# Patient Record
Sex: Female | Born: 1955 | Hispanic: Yes | Marital: Married | Smoking: Never smoker
Health system: Southern US, Community
[De-identification: ages and names within clinical notes are randomized; demographics above are authoritative.]

## PROBLEM LIST (undated history)

## (undated) DIAGNOSIS — F419 Anxiety disorder, unspecified: Secondary | ICD-10-CM

## (undated) DIAGNOSIS — E785 Hyperlipidemia, unspecified: Secondary | ICD-10-CM

## (undated) DIAGNOSIS — G459 Transient cerebral ischemic attack, unspecified: Secondary | ICD-10-CM

## (undated) HISTORY — PX: NEPHRECTOMY TRANSPLANTED ORGAN: SUR880

---

## 2020-08-01 DIAGNOSIS — F418 Other specified anxiety disorders: Secondary | ICD-10-CM | POA: Insufficient documentation

## 2020-08-01 DIAGNOSIS — K589 Irritable bowel syndrome without diarrhea: Secondary | ICD-10-CM | POA: Insufficient documentation

## 2020-08-01 DIAGNOSIS — M170 Bilateral primary osteoarthritis of knee: Secondary | ICD-10-CM | POA: Insufficient documentation

## 2020-08-01 DIAGNOSIS — M47816 Spondylosis without myelopathy or radiculopathy, lumbar region: Secondary | ICD-10-CM | POA: Insufficient documentation

## 2020-08-01 DIAGNOSIS — E785 Hyperlipidemia, unspecified: Secondary | ICD-10-CM | POA: Insufficient documentation

## 2020-08-05 DIAGNOSIS — I10 Essential (primary) hypertension: Secondary | ICD-10-CM | POA: Insufficient documentation

## 2020-08-05 DIAGNOSIS — R072 Precordial pain: Secondary | ICD-10-CM | POA: Insufficient documentation

## 2020-12-15 ENCOUNTER — Ambulatory Visit
Admission: EM | Admit: 2020-12-15 | Discharge: 2020-12-15 | Disposition: A | Discharge status: Home / Self Care | Payer: BC Managed Care – PPO | Attending: Emergency Medicine | Admitting: Emergency Medicine

## 2020-12-15 ENCOUNTER — Encounter: Payer: Self-pay | Admitting: Emergency Medicine

## 2020-12-15 ENCOUNTER — Other Ambulatory Visit: Payer: Self-pay

## 2020-12-15 DIAGNOSIS — R8290 Unspecified abnormal findings in urine: Secondary | ICD-10-CM | POA: Insufficient documentation

## 2020-12-15 DIAGNOSIS — R1084 Generalized abdominal pain: Secondary | ICD-10-CM | POA: Insufficient documentation

## 2020-12-15 DIAGNOSIS — R110 Nausea: Secondary | ICD-10-CM | POA: Insufficient documentation

## 2020-12-15 HISTORY — DX: Hyperlipidemia, unspecified: E78.5

## 2020-12-15 HISTORY — DX: Transient cerebral ischemic attack, unspecified: G45.9

## 2020-12-15 HISTORY — DX: Anxiety disorder, unspecified: F41.9

## 2020-12-15 LAB — POCT URINALYSIS DIP (MANUAL ENTRY)
Bilirubin, UA: NEGATIVE
Glucose, UA: NEGATIVE mg/dL
Ketones, POC UA: NEGATIVE mg/dL
Nitrite, UA: NEGATIVE
Spec Grav, UA: 1.015 (ref 1.010–1.025)
Urobilinogen, UA: 0.2 E.U./dL
pH, UA: 8 (ref 5.0–8.0)

## 2020-12-15 MED ORDER — ONDANSETRON 4 MG PO TBDP: 4.0000 mg | ORAL_TABLET | Freq: Three times a day (TID) | ORAL | 0 refills | Status: DC | PRN

## 2020-12-15 NOTE — ED Provider Notes (Signed)
Renee Patrick    CSN: [id number] Arrival date & time: 12/15/20  1518      History   Chief Complaint Chief Complaint  Patient presents with   Abdominal Pain    HPI Renee Patrick is a 65 y.o. female.  Patient presents with 1 week history of nausea and abdominal pain.  She also reports malodorous urine.  She has been taking Pepto-Bismol which has caused her stools to be dark.  Patient gave herself an enema yesterday and experienced some relief of her symptoms.  She denies fever, chills, vomiting, diarrhea, dysuria, hematuria, blood in stool, or other symptoms.  Her medical history includes TIA, hyperlipidemia, anxiety,  The history is provided by the patient and medical records.   Past Medical History:  Diagnosis Date   Anxiety    Hyperlipidemia    TIA (transient ischemic attack)     There are no problems to display for this patient.   Past Surgical History:  Procedure Laterality Date   CESAREAN SECTION  1987   CESAREAN SECTION  1990   NEPHRECTOMY TRANSPLANTED ORGAN      OB History   No obstetric history on file.      Home Medications    Prior to Admission medications   Medication Sig Start Date End Date Taking? Authorizing Provider  linaCLOtide (LINZESS PO) Take by mouth.   Yes [provider]  ondansetron (ZOFRAN ODT) 4 MG disintegrating tablet Take 1 tablet (4 mg total) by mouth every 8 (eight) hours as needed for nausea or vomiting. 12/15/20  Yes Mickie Bail, NP  sertraline (ZOLOFT) 100 MG tablet Take 100 mg by mouth daily. 05/28/20  Yes [provider]    Family History No family history on file.  Social History Social History   Tobacco Use   Smoking status: Never   Smokeless tobacco: Never  Vaping Use   Vaping Use: Never used  Substance Use Topics   Alcohol use: Not Currently   Drug use: Never     Allergies   Patient has no known allergies.   Review of Systems Review of Systems  Constitutional:  Negative for chills  and fever.  Respiratory:  Negative for cough and shortness of breath.   Cardiovascular:  Negative for chest pain and palpitations.  Gastrointestinal:  Positive for abdominal pain and nausea. Negative for blood in stool, diarrhea and vomiting.  Genitourinary:  Negative for dysuria and hematuria.       Malodorous urine.  Skin:  Negative for color change and rash.  All other systems reviewed and are negative.   Physical Exam Triage Vital Signs ED Triage Vitals  Enc Vitals Group     BP      Pulse      Resp      Temp      Temp src      SpO2      Weight      Height      Head Circumference      Peak Flow      Pain Score      Pain Loc      Pain Edu?      Excl. in GC?    No data found.  Updated Vital Signs BP (!) 145/88 (BP Location: Right Arm)   Pulse (!) 55   Temp 97.9 F (36.6 C) (Oral)   Resp 15   Ht 5\' 1"  (1.549 m)   Wt 135 lb (61.2 kg)   LMP  (LMP  Unknown)   SpO2 97%   BMI 25.51 kg/m   Visual Acuity Right Eye Distance:   Left Eye Distance:   Bilateral Distance:    Right Eye Near:   Left Eye Near:    Bilateral Near:     Physical Exam Vitals and nursing note reviewed.  Constitutional:      General: She is not in acute distress.    Appearance: She is well-developed. She is not ill-appearing.  HENT:     Head: Normocephalic and atraumatic.     Mouth/Throat:     Mouth: Mucous membranes are moist.  Eyes:     Conjunctiva/sclera: Conjunctivae normal.  Cardiovascular:     Rate and Rhythm: Normal rate and regular rhythm.     Heart sounds: Normal heart sounds.  Pulmonary:     Effort: Pulmonary effort is normal. No respiratory distress.     Breath sounds: Normal breath sounds.  Abdominal:     General: Bowel sounds are normal. There is no distension.     Palpations: Abdomen is soft.     Tenderness: There is no abdominal tenderness. There is no right CVA tenderness, left CVA tenderness, guarding or rebound.  Musculoskeletal:     Cervical back: Neck supple.   Skin:    General: Skin is warm and dry.  Neurological:     General: No focal deficit present.     Mental Status: She is alert and oriented to person, place, and time.     Gait: Gait normal.  Psychiatric:        Mood and Affect: Mood normal.        Behavior: Behavior normal.     UC Treatments / Results  Labs (all labs ordered are listed, but only abnormal results are displayed) Labs Reviewed  POCT URINALYSIS DIP (MANUAL ENTRY) - Abnormal; Notable for the following components:      Result Value   Clarity, UA cloudy (*)    Blood, UA trace-intact (*)    Protein Ur, POC trace (*)    Leukocytes, UA Moderate (2+) (*)    All other components within normal limits  URINE CULTURE    EKG   Radiology No results found.  Procedures Procedures (including critical care time)  Medications Ordered in UC Medications - No data to display  Initial Impression / Assessment and Plan / UC Course  I have reviewed the triage vital signs and the nursing notes.  Pertinent labs & imaging results that were available during my care of the patient were reviewed by me and considered in my medical decision making (see chart for details).  Malodorous urine, nausea without vomiting, generalized abdominal pain.  Patient is well-appearing and her exam is reassuring.  Abdomen is soft and nontender.  She is afebrile.  Instructed patient to go to the emergency department if she has acute abdominal pain or other concerning symptoms.  Urine culture pending; discussed with patient that we will call her if it shows the need for treatment.  Treating nausea with Zofran.  Instructed patient to keep her self hydrated with clear liquids.  Instructed patient to follow-up with her PCP if her symptoms are not improving.  She agrees to plan of care.   Final Clinical Impressions(s) / UC Diagnoses   Final diagnoses:  Malodorous urine  Nausea without vomiting  Generalized abdominal pain     Discharge Instructions       Go to the emergency department if you have acute abdominal pain or other concerning symptoms.  Take the antinausea medication as directed.    Keep yourself hydrated with clear liquids, such as water, Gatorade, Pedialyte, Sprite, or ginger ale.    Follow up with your primary care provider if your symptoms are not improving.          ED Prescriptions     Medication Sig Dispense Auth. Provider   ondansetron (ZOFRAN ODT) 4 MG disintegrating tablet Take 1 tablet (4 mg total) by mouth every 8 (eight) hours as needed for nausea or vomiting. 20 tablet Mickie Bail, NP      PDMP not reviewed this encounter.   Mickie Bail, NP 12/15/20 [phone]

## 2020-12-15 NOTE — ED Triage Notes (Signed)
Patient c/o  ABD pain x 1 week.   Patient denies fever, diarrhea, emesis, and blood in stool.   Patient denies dysuria or change in urine characteristics.   Patient endorses nausea.   Patient endorses dark stools at times. Patient endorses pain worsens with eating.   Patient has taken Pepto Bismol and Linzess w/ no relief of symptoms.   Patient has used enema w/ some relief of symptoms.

## 2020-12-15 NOTE — Discharge Instructions (Addendum)
Go to the emergency department if you have acute abdominal pain or other concerning symptoms.    Take the antinausea medication as directed.    Keep yourself hydrated with clear liquids, such as water, Gatorade, Pedialyte, Sprite, or ginger ale.    Follow up with your primary care provider if your symptoms are not improving.

## 2020-12-18 ENCOUNTER — Telehealth: Payer: Self-pay | Admitting: Emergency Medicine

## 2020-12-18 LAB — URINE CULTURE: Culture: 80000 — AB

## 2020-12-18 MED ORDER — NITROFURANTOIN MONOHYD MACRO 100 MG PO CAPS: 100.0000 mg | ORAL_CAPSULE | Freq: Two times a day (BID) | ORAL | 0 refills | Status: DC

## 2020-12-19 ENCOUNTER — Telehealth: Payer: Self-pay

## 2020-12-19 NOTE — Telephone Encounter (Signed)
Left vm to confirm 12/22/20 appointment-Toni 

## 2020-12-22 ENCOUNTER — Other Ambulatory Visit: Payer: Self-pay

## 2020-12-22 ENCOUNTER — Telehealth: Payer: Self-pay

## 2020-12-22 ENCOUNTER — Ambulatory Visit: Payer: BC Managed Care – PPO | Admitting: Physician Assistant

## 2020-12-22 ENCOUNTER — Encounter: Payer: Self-pay | Admitting: Physician Assistant

## 2020-12-22 DIAGNOSIS — K5900 Constipation, unspecified: Secondary | ICD-10-CM

## 2020-12-22 DIAGNOSIS — M25562 Pain in left knee: Secondary | ICD-10-CM

## 2020-12-22 DIAGNOSIS — E559 Vitamin D deficiency, unspecified: Secondary | ICD-10-CM

## 2020-12-22 DIAGNOSIS — F411 Generalized anxiety disorder: Secondary | ICD-10-CM | POA: Diagnosis not present

## 2020-12-22 DIAGNOSIS — M545 Low back pain, unspecified: Secondary | ICD-10-CM | POA: Diagnosis not present

## 2020-12-22 DIAGNOSIS — G8929 Other chronic pain: Secondary | ICD-10-CM

## 2020-12-22 DIAGNOSIS — M25561 Pain in right knee: Secondary | ICD-10-CM | POA: Diagnosis not present

## 2020-12-22 DIAGNOSIS — Z7689 Persons encountering health services in other specified circumstances: Secondary | ICD-10-CM

## 2020-12-22 DIAGNOSIS — E782 Mixed hyperlipidemia: Secondary | ICD-10-CM

## 2020-12-22 DIAGNOSIS — R5383 Other fatigue: Secondary | ICD-10-CM

## 2020-12-22 DIAGNOSIS — E538 Deficiency of other specified B group vitamins: Secondary | ICD-10-CM

## 2020-12-22 NOTE — Telephone Encounter (Signed)
Orthopedic referral sent via [facility] to [facility]-Toni 

## 2020-12-22 NOTE — Progress Notes (Signed)
[facility] [address] [place], [place] [address]  Internal MEDICINE  Office Visit Note  Patient Name: Renee Patrick  [id number]  [id number]  Date of Service: 12/28/2020   Complaints/HPI Pt is here for establishment of PCP. Chief Complaint  Patient presents with   New Patient (Initial Visit)    Need awv   Constipation   Anxiety   Knee Pain    REFERRAL FOR ORTHOPEDIC    HPI Pt is here to establish care -Pt was just married in Jan and moved from [place], and husband's job was in [place] but then moved to [place] -Was taking Linzess for constipation, takes laxative-Afleet, was thinking she was having bad reaction to linzess with bloating and would like to establish with GI -Colonoscopy 2 months ago-small polyps, had one removed -Was told she needed cholesterol med but had side effects and wants to work on diet. Mini stoke 4 yrs ago -Daughter's have concerns for her memory and want to see neurologist. 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 Around grandkids frequently-has 14 all together.  -Loves sports, but knee pain limits this, goes to concerts and sporting events. Looking for a job to keep her busy and know the area better -Is bone on bone in both knees, has been getting injections with ortho. Putting off replacements. Also has low back pain.  -never been a smoker, only drinks alcohol on occasion, denies any other substance use -Sleep is ok and eating well -Covid 1 month ago and starting to get energy back -Does take zoloft for anxiety and is well controlled  Current Medication: Outpatient Encounter Medications as of 12/22/2020  Medication Sig   ondansetron (ZOFRAN ODT) 4 MG disintegrating tablet Take 1 tablet (4 mg total) by mouth every 8 (eight) hours as needed for nausea or vomiting.   sertraline (ZOLOFT) 100 MG tablet Take 100 mg by mouth daily.   linaCLOtide (LINZESS PO) Take by mouth. (Patient not taking: Reported on  12/22/2020)   [DISCONTINUED] nitrofurantoin, macrocrystal-monohydrate, (MACROBID) 100 MG capsule Take 1 capsule (100 mg total) by mouth 2 (two) times daily.   No facility-administered encounter medications on file as of 12/22/2020.    Surgical History: Past Surgical History:  Procedure Laterality Date   CESAREAN SECTION  1987   CESAREAN SECTION  1990    Medical History: Past Medical History:  Diagnosis Date   Anxiety    Hyperlipidemia    TIA (transient ischemic attack)     Family History: Family History  Problem Relation Age of Onset   Ovarian cancer Mother    Lung cancer Father    Kidney disease Maternal Uncle    Hypertension Maternal Uncle     Social History   Socioeconomic History   Marital status: Married    Spouse name: Not on file   Number of children: Not on file   Years of education: Not on file   Highest education level: Not on file  Occupational History   Not on file  Tobacco Use   Smoking status: Never   Smokeless tobacco: Never  Vaping Use   Vaping Use: Never used  Substance and Sexual Activity   Alcohol use: Not Currently   Drug use: Never   Sexual activity: Not on file  Other Topics Concern   Not on file  Social History Narrative   Not on file   Social Determinants of Health   Financial Resource Strain: Not on file  Food Insecurity: Not on  file  Transportation Needs: Not on file  Physical Activity: Not on file  Stress: Not on file  Social Connections: Not on file  Intimate Partner Violence: Not on file     Review of Systems  Constitutional:  Negative for chills, fatigue and unexpected weight change.  HENT:  Negative for congestion, postnasal drip, rhinorrhea, sneezing and sore throat.   Eyes:  Negative for redness.  Respiratory:  Negative for cough, chest tightness and shortness of breath.   Cardiovascular:  Negative for chest pain and palpitations.  Gastrointestinal:  Negative for abdominal pain, constipation, diarrhea, nausea and  vomiting.  Genitourinary:  Negative for dysuria and frequency.  Musculoskeletal:  Positive for arthralgias and back pain. Negative for joint swelling and neck pain.  Skin:  Negative for rash.  Neurological: Negative.  Negative for tremors and numbness.  Hematological:  Negative for adenopathy. Does not bruise/bleed easily.  Psychiatric/Behavioral:  Negative for behavioral problems (Depression), sleep disturbance and suicidal ideas. The patient is not nervous/anxious.    Vital Signs: BP 132/80   Pulse 64   Temp 97.8 F (36.6 C)   Resp 16   Ht 5' 1.25" (1.556 m)   Wt 135 lb (61.2 kg)   LMP  (LMP Unknown)   SpO2 98%   BMI 25.30 kg/m    Physical Exam Vitals and nursing note reviewed.  Constitutional:      General: She is not in acute distress.    Appearance: She is well-developed and normal weight. She is not diaphoretic.  HENT:     Head: Normocephalic and atraumatic.     Mouth/Throat:     Pharynx: No oropharyngeal exudate.  Eyes:     Pupils: Pupils are equal, round, and reactive to light.  Neck:     Thyroid: No thyromegaly.     Vascular: No JVD.     Trachea: No tracheal deviation.  Cardiovascular:     Rate and Rhythm: Normal rate and regular rhythm.     Heart sounds: Normal heart sounds. No murmur heard.   No friction rub. No gallop.  Pulmonary:     Effort: Pulmonary effort is normal. No respiratory distress.     Breath sounds: No wheezing or rales.  Chest:     Chest wall: No tenderness.  Abdominal:     General: Bowel sounds are normal.     Palpations: Abdomen is soft.  Musculoskeletal:        General: Normal range of motion.     Cervical back: Normal range of motion and neck supple.  Lymphadenopathy:     Cervical: No cervical adenopathy.  Skin:    General: Skin is warm and dry.  Neurological:     Mental Status: She is alert and oriented to person, place, and time.     Cranial Nerves: No cranial nerve deficit.  Psychiatric:        Behavior: Behavior normal.         Thought Content: Thought content normal.        Judgment: Judgment normal.      Assessment/Plan: 1. GAD (generalized anxiety disorder) Well-controlled continue Zoloft  2. Constipation, unspecified constipation type Due to poor reaction to Linzess and over-the-counter is not working well patient will be referred to GI - Ambulatory referral to Gastroenterology  3. Chronic pain of both knees Patient looking to establish with orthopedics locally as she was previously receiving injections and will be referred to Ortho - AMB referral to orthopedics  4. Chronic bilateral low back pain  without sciatica Will refer to Ortho - AMB referral to orthopedics  5. Encounter to establish care with new doctor Patient here to establish care and baseline labs will be ordered  6. Mixed hyperlipidemia - Lipid Panel With LDL/HDL Ratio  7. B12 deficiency - B12 and Folate Panel  8. Vitamin D deficiency - VITAMIN D 25 Hydroxy (Vit-D Deficiency, Fractures)  9. Other fatigue - CBC w/Diff/Platelet - Comprehensive metabolic panel - TSH + free T4   General Counseling: Natalija verbalizes understanding of the findings of todays visit and agrees with plan of treatment. I have discussed any further diagnostic evaluation that may be needed or ordered today. We also reviewed her medications today. she has been encouraged to call the office with any questions or concerns that should arise related to todays visit.    Counseling:    Orders Placed This Encounter  Procedures   CBC w/Diff/Platelet   Comprehensive metabolic panel   Lipid Panel With LDL/HDL Ratio   TSH + free T4   B12 and Folate Panel   VITAMIN D 25 Hydroxy (Vit-D Deficiency, Fractures)   AMB referral to orthopedics   Ambulatory referral to Gastroenterology    No orders of the defined types were placed in this encounter.    This patient was seen by Lynn Ito, PA-C in collaboration with Dr. Beverely Risen as a part of  collaborative care agreement.   Time spent:35 Minutes

## 2021-01-06 ENCOUNTER — Encounter: Payer: Self-pay | Admitting: Gastroenterology

## 2021-01-06 ENCOUNTER — Other Ambulatory Visit: Payer: Self-pay

## 2021-01-06 ENCOUNTER — Ambulatory Visit: Payer: BC Managed Care – PPO | Admitting: Gastroenterology

## 2021-01-06 VITALS — BP 133/76 | HR 66 | Temp 98.1°F | Ht 61.25 in | Wt 137.0 lb

## 2021-01-06 DIAGNOSIS — K5909 Other constipation: Secondary | ICD-10-CM | POA: Diagnosis not present

## 2021-01-06 MED ORDER — LINACLOTIDE 290 MCG PO CAPS: 290.0000 ug | ORAL_CAPSULE | Freq: Every day | ORAL | 3 refills | Status: AC

## 2021-01-06 MED ORDER — LINACLOTIDE 290 MCG PO CAPS: 290.0000 ug | ORAL_CAPSULE | Freq: Every day | ORAL | 2 refills | Status: DC

## 2021-01-06 NOTE — Progress Notes (Signed)
Wyline Mood MD, MRCP(U.K) [address]  [address]  [place], [place] [address]  Main: [phone]  Fax: [phone]   Gastroenterology Consultation  Referring Provider:     Carlean Jews, PA* Primary Care Physician:  Carlean Jews, PA-C Primary Gastroenterologist:  Dr. Wyline Mood  Reason for Consultation:     Constipation        HPI:   Renee Patrick is a 65 y.o. y/o female referred for consultation & management  by  Carlean Jews, PA-C.    She states that she is here to see me for constipation ongoing for many years.  Does not have a bowel movement for 3 to 4 days at a time.  When she does it very hard in nature like rocks.  If she does not have a good bowel movement she has a lot of abdominal distention bloating and discomfort.  She was recently started on Linzess 290 mcg a day and has very good bowel movements with it.  She wanted to know if there are any long-term side effects with it.  Diet is probably not rich in fiber.  She had colonoscopy about 3 months back in [place] and was told it was normal.  She just moved back to the area recently.  Denies any rectal bleeding or any other symptoms.    Past Medical History:  Diagnosis Date   Anxiety    Hyperlipidemia    TIA (transient ischemic attack)     Past Surgical History:  Procedure Laterality Date   CESAREAN SECTION  1987   CESAREAN SECTION  1990    Prior to Admission medications   Medication Sig Start Date End Date Taking? Authorizing Provider  linaCLOtide (LINZESS PO) Take by mouth. Patient not taking: Reported on 12/22/2020    [provider]  ondansetron (ZOFRAN ODT) 4 MG disintegrating tablet Take 1 tablet (4 mg total) by mouth every 8 (eight) hours as needed for nausea or vomiting. 12/15/20   Mickie Bail, NP  rosuvastatin (CRESTOR) 40 MG tablet Take 40 mg by mouth daily. 12/11/20   [provider]  sertraline (ZOLOFT) 100 MG tablet Take 100 mg by mouth daily. 05/28/20    [provider]    Family History  Problem Relation Age of Onset   Ovarian cancer Mother    Lung cancer Father    Kidney disease Maternal Uncle    Hypertension Maternal Uncle      Social History   Tobacco Use   Smoking status: Never   Smokeless tobacco: Never  Vaping Use   Vaping Use: Never used  Substance Use Topics   Alcohol use: Not Currently   Drug use: Never    Allergies as of 01/06/2021   (No Known Allergies)    Review of Systems:    All systems reviewed and negative except where noted in HPI.   Physical Exam:  BP 133/76   Pulse 66   Temp 98.1 F (36.7 C) (Oral)   Ht 5' 1.25" (1.556 m)   Wt 137 lb (62.1 kg)   LMP  (LMP Unknown)   BMI 25.68 kg/m  No LMP recorded (lmp unknown). Patient is postmenopausal. Psych:  Alert and cooperative. Normal mood and affect. General:   Alert,  Well-developed, well-nourished, pleasant and cooperative in NAD Head:  Normocephalic and atraumatic. Eyes:  Sclera clear, no icterus.   Conjunctiva pink. Abdomen:  Normal bowel sounds.  No bruits.  Soft, non-tender and non-distended without masses, hepatosplenomegaly or  hernias noted.  No guarding or rebound tenderness.    Neurologic:  Alert and oriented x3;  grossly normal neurologically. Psych:  Alert and cooperative. Normal mood and affect.  Imaging Studies: No results found.  Assessment and Plan:   Renee Patrick is a 65 y.o. y/o female has been referred for chronic constipation.  No red flag signs.  Recent colonoscopy in [place] and was told it was normal.  Plan 1.  High-fiber diet counseled to obtain 25 g of fiber in her diet per day.  If not meeting goal to commence on Metamucil as a supplement. 2.  Linzess to 290 mcg, 90-day supply with 3 refills will be provided   Follow up in 3 to 4 months  Dr Wyline Mood MD,MRCP(U.K)

## 2021-01-13 ENCOUNTER — Telehealth: Payer: Self-pay

## 2021-01-13 NOTE — Telephone Encounter (Signed)
Patient scheduled w/ [facility] on 01/14/21 @ 1:30-Toni

## 2021-03-05 ENCOUNTER — Encounter: Payer: Self-pay | Admitting: Physician Assistant

## 2021-03-05 ENCOUNTER — Ambulatory Visit (INDEPENDENT_AMBULATORY_CARE_PROVIDER_SITE_OTHER): Payer: BC Managed Care – PPO | Admitting: Physician Assistant

## 2021-03-05 ENCOUNTER — Telehealth: Payer: Self-pay

## 2021-03-05 ENCOUNTER — Other Ambulatory Visit: Payer: Self-pay | Admitting: Family Medicine

## 2021-03-05 ENCOUNTER — Other Ambulatory Visit: Payer: Self-pay

## 2021-03-05 DIAGNOSIS — R300 Dysuria: Secondary | ICD-10-CM

## 2021-03-05 DIAGNOSIS — M25562 Pain in left knee: Secondary | ICD-10-CM

## 2021-03-05 DIAGNOSIS — M064 Inflammatory polyarthropathy: Secondary | ICD-10-CM

## 2021-03-05 DIAGNOSIS — R5383 Other fatigue: Secondary | ICD-10-CM

## 2021-03-05 DIAGNOSIS — G8929 Other chronic pain: Secondary | ICD-10-CM

## 2021-03-05 DIAGNOSIS — M545 Low back pain, unspecified: Secondary | ICD-10-CM | POA: Diagnosis not present

## 2021-03-05 DIAGNOSIS — Z124 Encounter for screening for malignant neoplasm of cervix: Secondary | ICD-10-CM

## 2021-03-05 DIAGNOSIS — E559 Vitamin D deficiency, unspecified: Secondary | ICD-10-CM

## 2021-03-05 DIAGNOSIS — M25561 Pain in right knee: Secondary | ICD-10-CM | POA: Diagnosis not present

## 2021-03-05 DIAGNOSIS — K5900 Constipation, unspecified: Secondary | ICD-10-CM

## 2021-03-05 DIAGNOSIS — E538 Deficiency of other specified B group vitamins: Secondary | ICD-10-CM

## 2021-03-05 DIAGNOSIS — Z0001 Encounter for general adult medical examination with abnormal findings: Secondary | ICD-10-CM | POA: Diagnosis not present

## 2021-03-05 DIAGNOSIS — F411 Generalized anxiety disorder: Secondary | ICD-10-CM | POA: Diagnosis not present

## 2021-03-05 DIAGNOSIS — Z01419 Encounter for gynecological examination (general) (routine) without abnormal findings: Secondary | ICD-10-CM

## 2021-03-05 DIAGNOSIS — E782 Mixed hyperlipidemia: Secondary | ICD-10-CM

## 2021-03-05 DIAGNOSIS — Z112 Encounter for screening for other bacterial diseases: Secondary | ICD-10-CM

## 2021-03-05 DIAGNOSIS — M5416 Radiculopathy, lumbar region: Secondary | ICD-10-CM

## 2021-03-05 NOTE — Progress Notes (Signed)
[facility] [place], [place] [address]  Internal MEDICINE  Office Visit Note  Patient Name: Renee Patrick  [id number]  [id number]  Date of Service: 03/10/2021  Chief Complaint  Patient presents with   Annual Exam   Anxiety   Hyperlipidemia     HPI Pt is here for routine health maintenance examination and has multiple medical concerns -Was in the hospital for bleeding ulcer and was given 2 units of blood. Also states a biopsy was done but has not heard results and has another procedure for endoscopy In Feb with office visit in Dec. -Ortho injected both knees yesterday, so they are currently painful -Did some PT for back but not full course, considering back injections. Going to been seen by physiatry -Arthritis in hand has been worse lately too -Mom passed with ovarian cancer -pt requests pap today, however states she had a partial hysterectomy at age 12...but still had been having pap done since then and is unsure about whether she has a cervix. -Unfortunately her old records are not available still and she was again told to obtain these so we can have up to date records for her medical history in order to best treat her -She did not have her labs done and will go now -UTD on colonoscopy and mammogram  Current Medication: Outpatient Encounter Medications as of 03/05/2021  Medication Sig   linaclotide (LINZESS) 290 MCG CAPS capsule Take 1 capsule (290 mcg total) by mouth daily.   ondansetron (ZOFRAN ODT) 4 MG disintegrating tablet Take 1 tablet (4 mg total) by mouth every 8 (eight) hours as needed for nausea or vomiting.   sertraline (ZOLOFT) 100 MG tablet Take 100 mg by mouth daily.   [DISCONTINUED] rosuvastatin (CRESTOR) 40 MG tablet Take 40 mg by mouth daily. (Patient not taking: Reported on 03/05/2021)   No facility-administered encounter medications on file as of 03/05/2021.    Surgical History: Past Surgical History:  Procedure Laterality  Date   [place]    Medical History: Past Medical History:  Diagnosis Date   Anxiety    Hyperlipidemia    TIA (transient ischemic attack)     Family History: Family History  Problem Relation Age of Onset   Ovarian cancer Mother    Lung cancer Father    Kidney disease Maternal Uncle    Hypertension Maternal Uncle       Review of Systems  Constitutional:  Negative for chills, fatigue and unexpected weight change.  HENT:  Negative for congestion, postnasal drip, rhinorrhea, sneezing and sore throat.   Eyes:  Negative for redness.  Respiratory:  Negative for cough, chest tightness and shortness of breath.   Cardiovascular:  Negative for chest pain and palpitations.  Gastrointestinal:  Negative for abdominal pain, constipation, diarrhea, nausea and vomiting.  Genitourinary:  Negative for dysuria and frequency.  Musculoskeletal:  Positive for arthralgias and back pain. Negative for joint swelling and neck pain.  Skin:  Negative for rash.  Neurological: Negative.  Negative for tremors and numbness.  Hematological:  Negative for adenopathy. Does not bruise/bleed easily.  Psychiatric/Behavioral:  Negative for behavioral problems (Depression), sleep disturbance and suicidal ideas. The patient is nervous/anxious.     Vital Signs: BP 119/76   Pulse 65   Temp 98 F (36.7 C)   Resp 16   Ht 5' 1.25" (1.556 m)   Wt 137 lb 9.6 oz (62.4 kg)   LMP  (LMP Unknown)  SpO2 99%   BMI 25.79 kg/m    Physical Exam Vitals and nursing note reviewed.  Constitutional:      General: She is not in acute distress.    Appearance: She is well-developed and normal weight. She is not diaphoretic.  HENT:     Head: Normocephalic and atraumatic.     Mouth/Throat:     Pharynx: No oropharyngeal exudate.  Eyes:     Pupils: Pupils are equal, round, and reactive to light.  Neck:     Thyroid: No thyromegaly.     Vascular: No JVD.     Trachea: No tracheal  deviation.  Cardiovascular:     Rate and Rhythm: Normal rate and regular rhythm.     Heart sounds: Normal heart sounds. No murmur heard.   No friction rub. No gallop.  Pulmonary:     Effort: Pulmonary effort is normal. No respiratory distress.     Breath sounds: No wheezing or rales.  Chest:     Chest wall: No tenderness.  Abdominal:     General: Bowel sounds are normal.     Palpations: Abdomen is soft.  Genitourinary:    Exam position: Lithotomy position.     Vagina: Vaginal discharge present.     Comments: Unable to visualize cervix, blind pap performed Musculoskeletal:        General: Normal range of motion.     Cervical back: Normal range of motion and neck supple.  Lymphadenopathy:     Cervical: No cervical adenopathy.  Skin:    General: Skin is warm and dry.  Neurological:     Mental Status: She is alert and oriented to person, place, and time.     Cranial Nerves: No cranial nerve deficit.  Psychiatric:        Behavior: Behavior normal.        Thought Content: Thought content normal.        Judgment: Judgment normal.     LABS: Recent Results (from the past 2160 hour(s))  POCT urinalysis dipstick     Status: Abnormal   Collection Time: 12/15/20  3:36 PM  Result Value Ref Range   Color, UA yellow yellow   Clarity, UA cloudy (A) clear   Glucose, UA negative negative mg/dL   Bilirubin, UA negative negative   Ketones, POC UA negative negative mg/dL   Spec Grav, UA 1.015 1.010 - 1.025   Blood, UA trace-intact (A) negative   pH, UA 8.0 5.0 - 8.0   Protein Ur, POC trace (A) negative mg/dL   Urobilinogen, UA 0.2 0.2 or 1.0 E.U./dL   Nitrite, UA Negative Negative   Leukocytes, UA Moderate (2+) (A) Negative  Urine Culture     Status: Abnormal   Collection Time: 12/15/20  3:55 PM   Specimen: Urine, Clean Catch  Result Value Ref Range   Specimen Description URINE, CLEAN CATCH    Special Requests      NONE Performed at [facility], [address]  [place], [place] [address]    Culture (A)     80,000 COLONIES/mL ESCHERICHIA COLI 10,000 COLONIES/mL ENTEROCOCCUS FAECALIS    Report Status 12/18/2020 FINAL    Organism ID, Bacteria ESCHERICHIA COLI (A)    Organism ID, Bacteria ENTEROCOCCUS FAECALIS (A)       Susceptibility   Escherichia coli - MIC*    AMPICILLIN >=32 RESISTANT Resistant     CEFAZOLIN <=4 SENSITIVE Sensitive     CEFEPIME <=0.12 SENSITIVE Sensitive     CEFTRIAXONE <=  0.25 SENSITIVE Sensitive     CIPROFLOXACIN <=0.25 SENSITIVE Sensitive     GENTAMICIN <=1 SENSITIVE Sensitive     IMIPENEM <=0.25 SENSITIVE Sensitive     NITROFURANTOIN <=16 SENSITIVE Sensitive     TRIMETH/SULFA <=20 SENSITIVE Sensitive     AMPICILLIN/SULBACTAM 16 INTERMEDIATE Intermediate     PIP/TAZO <=4 SENSITIVE Sensitive     * 80,000 COLONIES/mL ESCHERICHIA COLI   Enterococcus faecalis - MIC*    AMPICILLIN <=2 SENSITIVE Sensitive     NITROFURANTOIN <=16 SENSITIVE Sensitive     VANCOMYCIN 1 SENSITIVE Sensitive     * 10,000 COLONIES/mL ENTEROCOCCUS FAECALIS  CBC w/Diff/Platelet     Status: None   Collection Time: 03/05/21  1:20 PM  Result Value Ref Range   WBC 7.7 3.4 - 10.8 x10E3/uL   RBC 4.58 3.77 - 5.28 x10E6/uL   Hemoglobin 12.8 11.1 - 15.9 g/dL   Hematocrit 38.6 34.0 - 46.6 %   MCV 84 79 - 97 fL   MCH 27.9 26.6 - 33.0 pg   MCHC 33.2 31.5 - 35.7 g/dL   RDW 12.3 11.7 - 15.4 %   Platelets 257 150 - 450 x10E3/uL   Neutrophils 60 Not Estab. %   Lymphs 29 Not Estab. %   Monocytes 9 Not Estab. %   Eos 1 Not Estab. %   Basos 1 Not Estab. %   Neutrophils Absolute 4.6 1.4 - 7.0 x10E3/uL   Lymphocytes Absolute 2.2 0.7 - 3.1 x10E3/uL   Monocytes Absolute 0.7 0.1 - 0.9 x10E3/uL   EOS (ABSOLUTE) 0.1 0.0 - 0.4 x10E3/uL   Basophils Absolute 0.1 0.0 - 0.2 x10E3/uL   Immature Granulocytes 0 Not Estab. %   Immature Grans (Abs) 0.0 0.0 - 0.1 x10E3/uL  Comprehensive metabolic panel     Status: Abnormal   Collection Time: 03/05/21  1:20 PM  Result Value  Ref Range   Glucose 94 70 - 99 mg/dL   BUN 11 8 - 27 mg/dL   Creatinine, Ser 0.88 0.57 - 1.00 mg/dL   eGFR 73 >59 mL/min/1.73   BUN/Creatinine Ratio 13 12 - 28   Sodium 145 (H) 134 - 144 mmol/L   Potassium 4.2 3.5 - 5.2 mmol/L   Chloride 104 96 - 106 mmol/L   CO2 25 20 - 29 mmol/L   Calcium 9.7 8.7 - 10.3 mg/dL   Total Protein 7.0 6.0 - 8.5 g/dL   Albumin 4.7 3.8 - 4.8 g/dL   Globulin, Total 2.3 1.5 - 4.5 g/dL   Albumin/Globulin Ratio 2.0 1.2 - 2.2   Bilirubin Total 0.5 0.0 - 1.2 mg/dL   Alkaline Phosphatase 78 44 - 121 IU/L   AST 24 0 - 40 IU/L   ALT 11 0 - 32 IU/L  Lipid Panel With LDL/HDL Ratio     Status: Abnormal   Collection Time: 03/05/21  1:20 PM  Result Value Ref Range   Cholesterol, Total 267 (H) 100 - 199 mg/dL   Triglycerides 191 (H) 0 - 149 mg/dL   HDL 47 >39 mg/dL   VLDL Cholesterol Cal 36 5 - 40 mg/dL   LDL Chol Calc (NIH) 184 (H) 0 - 99 mg/dL   LDL/HDL Ratio 3.9 (H) 0.0 - 3.2 ratio    Comment:                                     LDL/HDL Ratio  Men  Women                               1/2 Avg.Risk  1.0    1.5                                   Avg.Risk  3.6    3.2                                2X Avg.Risk  6.2    5.0                                3X Avg.Risk  8.0    6.1   TSH + free T4     Status: None   Collection Time: 03/05/21  1:20 PM  Result Value Ref Range   TSH 0.763 0.450 - 4.500 uIU/mL   Free T4 1.22 0.82 - 1.77 ng/dL  B12 and Folate Panel     Status: Abnormal   Collection Time: 03/05/21  1:20 PM  Result Value Ref Range   Vitamin B-12 1,676 (H) 232 - 1,245 pg/mL   Folate >20.0 >3.0 ng/mL    Comment: A serum folate concentration of less than 3.1 ng/mL is considered to represent clinical deficiency.   VITAMIN D 25 Hydroxy (Vit-D Deficiency, Fractures)     Status: Abnormal   Collection Time: 03/05/21  1:20 PM  Result Value Ref Range   Vit D, 25-Hydroxy 27.5 (L) 30.0 - 100.0 ng/mL    Comment: Vitamin D  deficiency has been defined by the [place] practice guideline as a level of serum 25-OH vitamin D less than 20 ng/mL (1,2). The Endocrine Society went on to further define vitamin D insufficiency as a level between 21 and 29 ng/mL (2). 1. IOM (Institute of Medicine). 2010. Dietary reference    intakes for calcium and D. [employer]: The    [employer]. 2. Holick MF, Binkley Benld, Bischoff-Ferrari HA, et al.    Evaluation, treatment, and prevention of vitamin D    deficiency: an Endocrine Society clinical practice    guideline. JCEM. 2011 Jul; 96(7):1911-30.   Rheumatoid Factor     Status: None   Collection Time: 03/05/21  1:20 PM  Result Value Ref Range   Rhuematoid fact SerPl-aCnc <10.0 <14.0 IU/mL  ANA w/Reflex if Positive     Status: Abnormal   Collection Time: 03/05/21  1:20 PM  Result Value Ref Range   Anti Nuclear Antibody (ANA) Positive (A) Negative   dsDNA Ab 1 0 - 9 IU/mL    Comment:                                    Negative      <5                                    Equivocal  5 - 9  Positive      >9    ENA RNP Ab 0.2 0.0 - 0.9 AI   ENA SM Ab Ser-aCnc <0.2 0.0 - 0.9 AI   Scleroderma (Scl-70) (ENA) Antibody, IgG <0.2 0.0 - 0.9 AI   ENA SSA (RO) Ab 1.1 (H) 0.0 - 0.9 AI   ENA SSB (LA) Ab <0.2 0.0 - 0.9 AI   Chromatin Ab SerPl-aCnc <0.2 0.0 - 0.9 AI   Anti JO-1 <0.2 0.0 - 0.9 AI   Centromere Ab Screen <0.2 0.0 - 0.9 AI   See below: Comment     Comment: Autoantibody                       Disease Association ------------------------------------------------------------                         Condition                  Frequency ---------------------   ------------------------   --------- Antinuclear Antibody,    SLE, mixed connective Direct (ANA-D)           tissue diseases ---------------------   ------------------------   --------- dsDNA                    SLE                         40 - 60% ---------------------   ------------------------   --------- Chromatin                Drug induced SLE                90%                          SLE                        48 - 97% ---------------------   ------------------------   --------- SSA (Ro)                 SLE                        25 - 35%                          Sjogren's Syndrome         40 - 70%                          Neonatal Lupus                 100% ---------------------   ------------------------   --------- SSB (La)                 SLE                              10%                          Sjogren's Syndrome              30% ---------------------   -----------------------    --------- Sm (anti-Smith)  SLE                        15 - 30% ---------------------   -----------------------    --------- RNP                      Mixed Connective Tissue                          Disease                         95% (U1 nRNP,                SLE                        30 - 50% anti-ribonucleoprotein)  Polymyositis and/or                          Dermatomyositis                 20% ---------------------   ------------------------   --------- Scl-70 (antiDNA          Scleroderma (diffuse)      20 - 35% topoisomerase)           Crest                           13% ---------------------   ------------------------   --------- Jo-1                     Polymyositis and/or                          Dermatomyositis            20 - 40% ---------------------   ------------------------   --------- Centromere B             Scleroderma -  Crest                          variant                         80%   IGP, Aptima HPV     Status: None   Collection Time: 03/05/21  2:39 PM  Result Value Ref Range   Interpretation NILM     Comment: NEGATIVE FOR INTRAEPITHELIAL LESION OR MALIGNANCY.   Category NIL     Comment: Negative for Intraepithelial Lesion   Adequacy SECNI     Comment: Satisfactory for evaluation. No  endocervical component is identified.   Clinician Provided ICD10 Comment     Comment: Z12.4   Performed by: Comment     Comment: Krystal Eaton, Cytotechnologist (ASCP)   Note: Comment     Comment: The Pap smear is a screening test designed to aid in the detection of premalignant and malignant conditions of the uterine cervix.  It is not a diagnostic procedure and should not be used as the sole means of detecting cervical cancer.  Both false-positive and false-negative reports do occur.    Test Methodology Comment     Comment: This liquid based ThinPrep(R) pap test was screened with the use of an image guided system.  HPV Aptima Negative Negative    Comment: This nucleic acid amplification test detects fourteen high-risk HPV types (16,18,31,33,35,39,45,51,52,56,58,59,66,68) without differentiation.   UA/M w/rflx Culture, Routine     Status: Abnormal (Preliminary result)   Collection Time: 03/05/21  2:39 PM   Specimen: Urine   Urine  Result Value Ref Range   Specific Gravity, UA 1.014 1.005 - 1.030   pH, UA 6.0 5.0 - 7.5   Color, UA Yellow Yellow   Appearance Ur Cloudy (A) Clear   Leukocytes,UA Negative Negative   Protein,UA Negative Negative/Trace   Glucose, UA Negative Negative   Ketones, UA Negative Negative   RBC, UA Negative Negative   Bilirubin, UA Negative Negative   Urobilinogen, Ur 0.2 0.2 - 1.0 mg/dL   Nitrite, UA Negative Negative   Microscopic Examination Comment     Comment: Microscopic follows if indicated.   Microscopic Examination See below:     Comment: Microscopic was indicated and was performed.   Urinalysis Reflex Comment     Comment: This specimen has reflexed to a Urine Culture.  NuSwab Vaginitis Plus (VG+)     Status: Abnormal   Collection Time: 03/05/21  2:39 PM  Result Value Ref Range   Atopobium vaginae Low - 0 Score   BVAB 2 Low - 0 Score   Megasphaera 1 Low - 0 Score    Comment: Calculate total score by adding the 3 individual  bacterial vaginosis (BV) marker scores together.  Total score is interpreted as follows: Total score 0-1: Indicates the absence of BV. Total score   2: Indeterminate for BV. Additional clinical                  data should be evaluated to establish a                  diagnosis. Total score 3-6: Indicates the presence of BV. This test was developed and its performance characteristics determined by [facility].  It has not been cleared or approved by the Food and Drug Administration.    Candida albicans, NAA Positive (A) Negative   Candida glabrata, NAA Negative Negative   Trich vag by NAA Negative Negative   Chlamydia trachomatis, NAA Negative Negative   Neisseria gonorrhoeae, NAA Negative Negative  Microscopic Examination     Status: Abnormal   Collection Time: 03/05/21  2:39 PM   Urine  Result Value Ref Range   WBC, UA 6-10 (A) 0 - 5 /hpf   RBC None seen 0 - 2 /hpf   Epithelial Cells (non renal) None seen 0 - 10 /hpf   Casts None seen None seen /lpf   Bacteria, UA Many (A) None seen/Few  Urine Culture, Reflex     Status: None (Preliminary result)   Collection Time: 03/05/21  2:39 PM   Urine  Result Value Ref Range   Urine Culture, Routine WILL FOLLOW         Assessment/Plan: 1. Encounter for general adult medical examination with abnormal findings CPE performed, routine labs ordered, UTD on mammogram and colonoscopy. Pt will work on obtaining medical records  2. GAD (generalized anxiety disorder) Stable, continue zoloft  3. Chronic pain of both knees Followed by ortho  4. Chronic bilateral low back pain without sciatica Has upcoming visit with physiatry  5. Inflammatory polyarthritis ([place]) Will check labs - Rheumatoid Factor - ANA w/Reflex if Positive  6. Constipation, unspecified constipation type Followed by GI, on Linzess  7. Routine cervical smear - IGP, Aptima HPV  8. Visit for gynecologic examination Pelvic exam performed  9. Encounter for screening  examination for bacterial disease - NuSwab Vaginitis Plus (VG+)  10. Vitamin D deficiency - VITAMIN D 25 Hydroxy (Vit-D Deficiency, Fractures)  11. Mixed hyperlipidemia - Lipid Panel With LDL/HDL Ratio  12. B12 deficiency - B12 and Folate Panel  13. Other fatigue - CBC w/Diff/Platelet - Comprehensive metabolic panel - TSH + free T4  14. Dysuria - UA/M w/rflx Culture, Routine   General Counseling: orvella digiulio understanding of the findings of todays visit and agrees with plan of treatment. I have discussed any further diagnostic evaluation that may be needed or ordered today. We also reviewed her medications today. she has been encouraged to call the office with any questions or concerns that should arise related to todays visit.    Counseling:    Orders Placed This Encounter  Procedures   Microscopic Examination   Urine Culture, Reflex   UA/M w/rflx Culture, Routine   NuSwab Vaginitis Plus (VG+)   CBC w/Diff/Platelet   Comprehensive metabolic panel   Lipid Panel With LDL/HDL Ratio   TSH + free T4   B12 and Folate Panel   VITAMIN D 25 Hydroxy (Vit-D Deficiency, Fractures)   Rheumatoid Factor   ANA w/Reflex if Positive    No orders of the defined types were placed in this encounter.   This patient was seen by Drema Dallas, PA-C in collaboration with Dr. Clayborn Bigness as a part of collaborative care agreement.  Total time spent:40 Minutes  Time spent includes review of chart, medications, test results, and follow up plan with the patient.     Lavera Guise, MD  Internal Medicine

## 2021-03-05 NOTE — Telephone Encounter (Signed)
Medical release request faxed to [facility] [phone]. Sent to be scanned-Toni

## 2021-03-06 LAB — ANA W/REFLEX IF POSITIVE
Anti JO-1: <0.2 AI (ref 0.0–0.9)
Anti Nuclear Antibody (ANA): POSITIVE — AB
Centromere Ab Screen: <0.2 AI (ref 0.0–0.9)
Chromatin Ab SerPl-aCnc: <0.2 AI (ref 0.0–0.9)
ENA RNP Ab: 0.2 AI (ref 0.0–0.9)
ENA SM Ab Ser-aCnc: <0.2 AI (ref 0.0–0.9)
ENA SSA (RO) Ab: 1.1 AI — ABNORMAL HIGH (ref 0.0–0.9)
ENA SSB (LA) Ab: <0.2 AI (ref 0.0–0.9)
Scleroderma (Scl-70) (ENA) Antibody, IgG: <0.2 AI (ref 0.0–0.9)
dsDNA Ab: 1 IU/mL (ref 0–9)

## 2021-03-06 LAB — COMPREHENSIVE METABOLIC PANEL
ALT: 11 IU/L (ref 0–32)
AST: 24 IU/L (ref 0–40)
Albumin/Globulin Ratio: 2 (ref 1.2–2.2)
Albumin: 4.7 g/dL (ref 3.8–4.8)
Alkaline Phosphatase: 78 IU/L (ref 44–121)
BUN/Creatinine Ratio: 13 (ref 12–28)
BUN: 11 mg/dL (ref 8–27)
Bilirubin Total: 0.5 mg/dL (ref 0.0–1.2)
CO2: 25 mmol/L (ref 20–29)
Calcium: 9.7 mg/dL (ref 8.7–10.3)
Chloride: 104 mmol/L (ref 96–106)
Creatinine, Ser: 0.88 mg/dL (ref 0.57–1.00)
Globulin, Total: 2.3 g/dL (ref 1.5–4.5)
Glucose: 94 mg/dL (ref 70–99)
Potassium: 4.2 mmol/L (ref 3.5–5.2)
Sodium: 145 mmol/L — ABNORMAL HIGH (ref 134–144)
Total Protein: 7 g/dL (ref 6.0–8.5)
eGFR: 73 mL/min/{1.73_m2} (ref >59)

## 2021-03-06 LAB — TSH+FREE T4
Free T4: 1.22 ng/dL (ref 0.82–1.77)
TSH: 0.763 u[IU]/mL (ref 0.450–4.500)

## 2021-03-06 LAB — CBC WITH DIFFERENTIAL/PLATELET
Basophils Absolute: 0.1 10*3/uL (ref 0.0–0.2)
Basos: 1 %
EOS (ABSOLUTE): 0.1 10*3/uL (ref 0.0–0.4)
Eos: 1 %
Hematocrit: 38.6 % (ref 34.0–46.6)
Hemoglobin: 12.8 g/dL (ref 11.1–15.9)
Immature Grans (Abs): 0 10*3/uL (ref 0.0–0.1)
Immature Granulocytes: 0 %
Lymphocytes Absolute: 2.2 10*3/uL (ref 0.7–3.1)
Lymphs: 29 %
MCH: 27.9 pg (ref 26.6–33.0)
MCHC: 33.2 g/dL (ref 31.5–35.7)
MCV: 84 fL (ref 79–97)
Monocytes Absolute: 0.7 10*3/uL (ref 0.1–0.9)
Monocytes: 9 %
Neutrophils Absolute: 4.6 10*3/uL (ref 1.4–7.0)
Neutrophils: 60 %
Platelets: 257 10*3/uL (ref 150–450)
RBC: 4.58 x10E6/uL (ref 3.77–5.28)
RDW: 12.3 % (ref 11.7–15.4)
WBC: 7.7 10*3/uL (ref 3.4–10.8)

## 2021-03-06 LAB — LIPID PANEL WITH LDL/HDL RATIO
Cholesterol, Total: 267 mg/dL — ABNORMAL HIGH (ref 100–199)
HDL: 47 mg/dL (ref >39)
LDL Chol Calc (NIH): 184 mg/dL — ABNORMAL HIGH (ref 0–99)
LDL/HDL Ratio: 3.9 ratio — ABNORMAL HIGH (ref 0.0–3.2)
Triglycerides: 191 mg/dL — ABNORMAL HIGH (ref 0–149)
VLDL Cholesterol Cal: 36 mg/dL (ref 5–40)

## 2021-03-06 LAB — RHEUMATOID FACTOR: Rheumatoid fact SerPl-aCnc: <10 IU/mL (ref <14.0)

## 2021-03-06 LAB — VITAMIN D 25 HYDROXY (VIT D DEFICIENCY, FRACTURES): Vit D, 25-Hydroxy: 27.5 ng/mL — ABNORMAL LOW (ref 30.0–100.0)

## 2021-03-06 LAB — B12 AND FOLATE PANEL
Folate: >20 ng/mL (ref >3.0)
Vitamin B-12: 1676 pg/mL — ABNORMAL HIGH (ref 232–1245)

## 2021-03-08 LAB — NUSWAB VAGINITIS PLUS (VG+)
Candida albicans, NAA: POSITIVE — AB
Candida glabrata, NAA: NEGATIVE
Chlamydia trachomatis, NAA: NEGATIVE
Neisseria gonorrhoeae, NAA: NEGATIVE
Trich vag by NAA: NEGATIVE

## 2021-03-09 LAB — IGP, APTIMA HPV: HPV Aptima: NEGATIVE

## 2021-03-13 LAB — UA/M W/RFLX CULTURE, ROUTINE
Bilirubin, UA: NEGATIVE
Glucose, UA: NEGATIVE
Ketones, UA: NEGATIVE
Leukocytes,UA: NEGATIVE
Nitrite, UA: NEGATIVE
Protein,UA: NEGATIVE
RBC, UA: NEGATIVE
Specific Gravity, UA: 1.014 (ref 1.005–1.030)
Urobilinogen, Ur: 0.2 mg/dL (ref 0.2–1.0)
pH, UA: 6 (ref 5.0–7.5)

## 2021-03-13 LAB — MICROSCOPIC EXAMINATION
Casts: NONE SEEN /lpf
Epithelial Cells (non renal): NONE SEEN /hpf (ref 0–10)
RBC, Urine: NONE SEEN /hpf (ref 0–2)

## 2021-03-13 LAB — URINE CULTURE, REFLEX

## 2021-03-16 ENCOUNTER — Emergency Department
Admission: EM | Admit: 2021-03-16 | Discharge: 2021-03-16 | Disposition: A | Discharge status: Home / Self Care | Payer: BC Managed Care – PPO | Attending: Emergency Medicine | Admitting: Emergency Medicine

## 2021-03-16 ENCOUNTER — Other Ambulatory Visit: Payer: Self-pay

## 2021-03-16 ENCOUNTER — Encounter: Payer: Self-pay | Admitting: Emergency Medicine

## 2021-03-16 ENCOUNTER — Emergency Department: Payer: BC Managed Care – PPO

## 2021-03-16 DIAGNOSIS — M542 Cervicalgia: Secondary | ICD-10-CM | POA: Diagnosis present

## 2021-03-16 DIAGNOSIS — R519 Headache, unspecified: Secondary | ICD-10-CM | POA: Diagnosis not present

## 2021-03-16 DIAGNOSIS — R911 Solitary pulmonary nodule: Secondary | ICD-10-CM | POA: Diagnosis not present

## 2021-03-16 DIAGNOSIS — I10 Essential (primary) hypertension: Secondary | ICD-10-CM | POA: Diagnosis not present

## 2021-03-16 LAB — CBC WITH DIFFERENTIAL/PLATELET
Abs Immature Granulocytes: 0.03 10*3/uL (ref 0.00–0.07)
Basophils Absolute: 0 10*3/uL (ref 0.0–0.1)
Basophils Relative: 0 %
Eosinophils Absolute: 0.1 10*3/uL (ref 0.0–0.5)
Eosinophils Relative: 1 %
HCT: 37 % (ref 36.0–46.0)
Hemoglobin: 11.8 g/dL — ABNORMAL LOW (ref 12.0–15.0)
Immature Granulocytes: 0 %
Lymphocytes Relative: 23 %
Lymphs Abs: 2.3 10*3/uL (ref 0.7–4.0)
MCH: 27.6 pg (ref 26.0–34.0)
MCHC: 31.9 g/dL (ref 30.0–36.0)
MCV: 86.4 fL (ref 80.0–100.0)
Monocytes Absolute: 0.9 10*3/uL (ref 0.1–1.0)
Monocytes Relative: 9 %
Neutro Abs: 6.5 10*3/uL (ref 1.7–7.7)
Neutrophils Relative %: 67 %
Platelets: 228 10*3/uL (ref 150–400)
RBC: 4.28 MIL/uL (ref 3.87–5.11)
RDW: 12.4 % (ref 11.5–15.5)
WBC: 9.9 10*3/uL (ref 4.0–10.5)
nRBC: 0 % (ref 0.0–0.2)

## 2021-03-16 LAB — COMPREHENSIVE METABOLIC PANEL
ALT: 15 U/L (ref 0–44)
AST: 26 U/L (ref 15–41)
Albumin: 4 g/dL (ref 3.5–5.0)
Alkaline Phosphatase: 68 U/L (ref 38–126)
Anion gap: 9 (ref 5–15)
BUN: 11 mg/dL (ref 8–23)
CO2: 25 mmol/L (ref 22–32)
Calcium: 9.3 mg/dL (ref 8.9–10.3)
Chloride: 105 mmol/L (ref 98–111)
Creatinine, Ser: 0.8 mg/dL (ref 0.44–1.00)
GFR, Estimated: >60 mL/min (ref >60)
Glucose, Bld: 123 mg/dL — ABNORMAL HIGH (ref 70–99)
Potassium: 3.4 mmol/L — ABNORMAL LOW (ref 3.5–5.1)
Sodium: 139 mmol/L (ref 135–145)
Total Bilirubin: 0.8 mg/dL (ref 0.3–1.2)
Total Protein: 7.2 g/dL (ref 6.5–8.1)

## 2021-03-16 IMAGING — CT CT ANGIO HEAD-NECK (W OR W/O PERF)
1 of 11 series · 5 of 35 positions shown · IV contrast (APPLIED)
Comparison: None.

CLINICAL DATA: Carotid artery dissection suspected

EXAM:
CT ANGIOGRAPHY HEAD AND NECK
TECHNIQUE: Multidetector CT imaging of the head and neck was performed using
the standard protocol during bolus administration of intravenous
contrast. Multiplanar CT image reconstructions and MIPs were
obtained to evaluate the vascular anatomy. Carotid stenosis
measurements (when applicable) are obtained utilizing NASCET
criteria, using the distal internal carotid diameter as the
denominator.
CONTRAST:  75mL OMNIPAQUE IOHEXOL 350 MG/ML SOLN

[Series 10: ax thin · axial · 0.46mm/px · z∈[-350,-140]mm · 5 of 320 slices shown]
[im 54/320  soft-tissue]
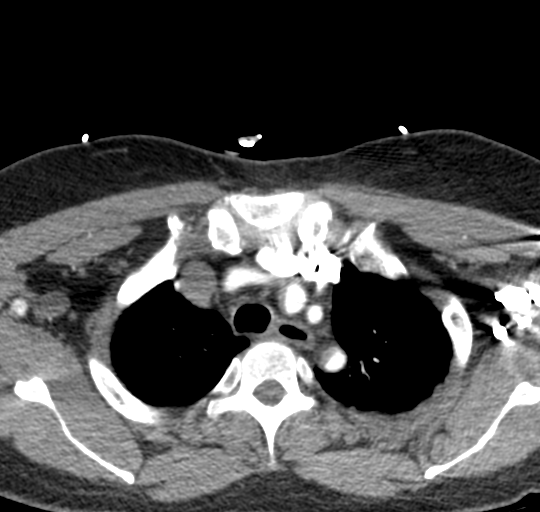
[im 107/320  bone]
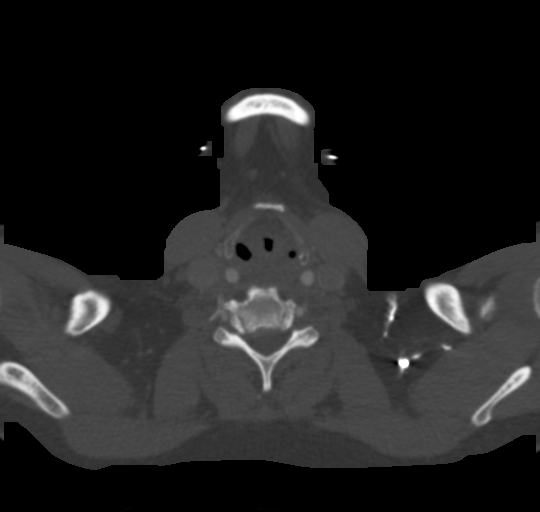
[im 160/320  soft-tissue]
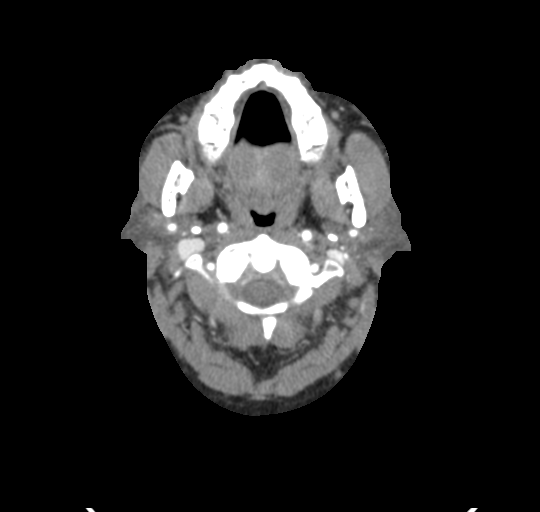
[im 213/320  bone]
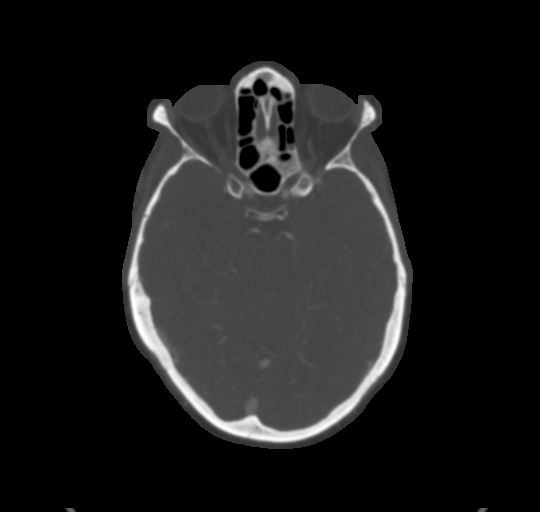
[im 266/320  soft-tissue]
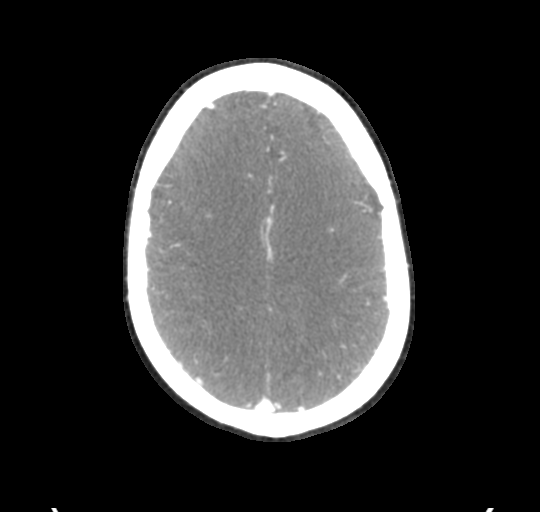

[5 of 35 positions shown; findings below may reference images not displayed]

FINDINGS: CT HEAD FINDINGS

Brain: No evidence of acute infarction, hemorrhage, hydrocephalus,
extra-axial collection or mass lesion/mass effect. Patchy white
matter hypoattenuation, nonspecific but compatible with chronic
microvascular ischemic disease.

Vascular: See below.

Skull: No evidence of acute fracture.

Sinuses: Mild paranasal sinus mucosal thickening.

Orbits: No acute finding.

Review of the MIP images confirms the above findings

CTA NECK FINDINGS

Aortic arch: Great vessel origins are patent.

Right carotid system: No evidence of dissection, stenosis (50% or
greater) or occlusion.

Left carotid system: No evidence of dissection, stenosis (50% or
greater) or occlusion.

Vertebral arteries: Codominant. No evidence of dissection, stenosis
(50% or greater) or occlusion.

Skeleton: Multilevel degenerative change, greatest at C4-C5 where
there is disc height loss, endplate sclerosis, and posterior
disc/osteophyte complex.

Other neck: No evidence of acute abnormality.

Upper chest: Approximately 9 mm pulmonary nodule within the left
upper lobe, possibly spiculated.

Review of the MIP images confirms the above findings

CTA HEAD FINDINGS

Anterior circulation: Bilateral intracranial ICAs are patent with
mild atherosclerosis. Bilateral MCAs and ACAs are patent without
proximal hemodynamically significant stenosis. No aneurysm
identified.

Posterior circulation: Bilateral intradural vertebral arteries,
basilar artery and posterior cerebral arteries are patent without
proximal hemodynamically significant stenosis. No aneurysm
identified.

Venous sinuses: As permitted by contrast timing, patent.

Review of the MIP images confirms the above findings
IMPRESSION: 1. Approximately 9 mm pulmonary nodule in the left upper lobe,
possibly spiculated. Recommend CT of the chest for further
evaluation.
2. No large vessel occlusion, proximal hemodynamically significant
stenosis, or evidence of dissection in the head or neck.

## 2021-03-16 MED ORDER — ONDANSETRON HCL 4 MG/2ML IJ SOLN
INTRAMUSCULAR | Status: AC
  Administered 2021-03-16: 4 mg via INTRAVENOUS
  Filled 2021-03-16: qty 2

## 2021-03-16 MED ORDER — OXYCODONE HCL 5 MG PO TABS: 5.0000 mg | ORAL_TABLET | Freq: Three times a day (TID) | ORAL | 0 refills | Status: AC | PRN

## 2021-03-16 MED ORDER — MORPHINE SULFATE (PF) 4 MG/ML IV SOLN
4.0000 mg | Freq: Once | INTRAVENOUS | Status: AC
  Administered 2021-03-16: 4 mg via INTRAVENOUS
  Filled 2021-03-16: qty 1

## 2021-03-16 MED ORDER — OXYCODONE-ACETAMINOPHEN 5-325 MG PO TABS
1.0000 | ORAL_TABLET | Freq: Once | ORAL | Status: AC
  Administered 2021-03-16: 1 via ORAL
  Filled 2021-03-16: qty 1

## 2021-03-16 MED ORDER — IOHEXOL 350 MG/ML SOLN
75.0000 mL | Freq: Once | INTRAVENOUS | Status: AC | PRN
  Administered 2021-03-16: 75 mL via INTRAVENOUS

## 2021-03-16 MED ORDER — ONDANSETRON 4 MG PO TBDP: 4.0000 mg | ORAL_TABLET | Freq: Three times a day (TID) | ORAL | 0 refills | Status: AC | PRN

## 2021-03-16 MED ORDER — CYCLOBENZAPRINE HCL 5 MG PO TABS: 5.0000 mg | ORAL_TABLET | Freq: Three times a day (TID) | ORAL | 0 refills | Status: AC | PRN

## 2021-03-16 MED ORDER — ONDANSETRON HCL 4 MG/2ML IJ SOLN: 4.0000 mg | Freq: Once | INTRAMUSCULAR | Status: AC

## 2021-03-16 NOTE — Discharge Instructions (Addendum)
Your CAT scan of the head and neck were reassuring. You can take the Flexeril as needed for pain.  Please try to limit the use of oxycodone to breakthrough pain at night.  Do not combine these medications as this can cause oversedation and increased risk of falls.  We did find a nodule on your lung which you should follow-up with a primary care provider about as you should have a CAT scan of your chest to follow-up.
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
## 2021-03-16 NOTE — ED Provider Notes (Signed)
[facility]  ____________________________________________   Event Date/Time   First MD Initiated Contact with Patient 03/16/21 [phone]     (approximate)  I have reviewed the triage vital signs and the nursing notes.   HISTORY  Chief Complaint Torticollis (/)    HPI Renee Patrick is a 65 y.o. female pmh anxiety, HTN, HLD who presents with neck pain.  Patient symptoms started 2 days ago.  Initially started on the right side of her neck and is since migrated to the left.  She endorses pain is now bilateral and radiating to the back of her head.  She denies associated nausea or vomiting.  Denies associated visual change, numbness or weakness in her upper or lower extremities.  She denies fevers or chills.  She did take a Tylenol at home.       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 Authorizing Provider  cyclobenzaprine (FLEXERIL) 5 MG tablet Take 1 tablet (5 mg total) by mouth 3 (three) times daily as needed for muscle spasms. 03/16/21  Yes Georga Hacking, MD  oxyCODONE (ROXICODONE) 5 MG immediate release tablet Take 1 tablet (5 mg total) by mouth every 8 (eight) hours as needed for up to 5 days. 03/16/21 03/21/21 Yes Georga Hacking, MD  linaclotide [facility]) 290 MCG CAPS capsule Take 1 capsule (290 mcg total) by mouth daily. 01/06/21   Wyline Mood, MD  ondansetron (ZOFRAN ODT) 4 MG  disintegrating tablet Take 1 tablet (4 mg total) by mouth every 8 (eight) hours as needed for nausea or vomiting. 12/15/20   Mickie Bail, NP  sertraline (ZOLOFT) 100 MG tablet Take 100 mg by mouth daily. 05/28/20   [provider]    Allergies Patient has no known allergies.  Family History  Problem Relation Age of Onset   Ovarian cancer Mother    Lung cancer Father    Kidney disease Maternal Uncle    Hypertension Maternal Uncle     Social History Social History   Tobacco Use   Smoking status: Never   Smokeless tobacco: Never  Vaping Use   Vaping Use: Never used  Substance Use Topics   Alcohol use: Not Currently   Drug use: Never    Review of Systems   Review of Systems  Constitutional:  Negative for fever.  Eyes:  Negative for visual disturbance.  Respiratory:  Negative for shortness of breath.   Cardiovascular:  Negative for chest pain.  Gastrointestinal:  Negative for nausea and vomiting.  Musculoskeletal:  Positive for neck pain. Negative for back pain.  Neurological:  Positive for headaches. Negative for weakness and numbness.  All other systems reviewed and are negative.  Physical Exam Updated Vital Signs BP (!) 143/75   Pulse 63   Temp 98.1 F (36.7 C) (Oral)   Resp 15   LMP  (LMP Unknown)   SpO2 97%  Physical Exam Vitals and nursing note reviewed.  Constitutional:      General: She is in acute distress.     Appearance: Normal appearance.     Comments: Patient does not want to move her neck, she is tearful and groaning in pain  HENT:     Head: Normocephalic and atraumatic.  Eyes:     General: No scleral icterus.    Conjunctiva/sclera: Conjunctivae normal.  Neck:     Comments: No midline C-spine tenderness, mild tenderness to palpation in the bilateral occipital regions  Is able to range her neck 45 degrees left and right and touch her chin to her chest Pulmonary:     Effort: Pulmonary effort is normal. No respiratory distress.      Breath sounds: No stridor.  Musculoskeletal:        General: No deformity or signs of injury.  Skin:    General: Skin is dry.     Coloration: Skin is not jaundiced or pale.  Neurological:     General: No focal deficit present.     Mental Status: She is alert and oriented to person, place, and time. Mental status is at baseline.     Comments: Aox3, nml speech  PERRL, EOMI, face symmetric, nml tongue movement  5/5 strength in the BL upper and lower extremities  Sensation grossly intact in the BL upper and lower extremities  Finger-nose-finger intact BL   Psychiatric:        Mood and Affect: Mood normal.        Behavior: Behavior normal.     LABS (all labs ordered are listed, but only abnormal results are displayed)  Labs Reviewed  COMPREHENSIVE METABOLIC PANEL - Abnormal; Notable for the following components:      Result Value   Potassium 3.4 (*)    Glucose, Bld 123 (*)    All other components within normal limits  CBC WITH DIFFERENTIAL/PLATELET - Abnormal; Notable for the following components:   Hemoglobin 11.8 (*)    All other components within normal limits   ____________________________________________  EKG  N/a ____________________________________________  RADIOLOGY Almeta Monas, personally viewed and evaluated these images (plain radiographs) as part of my medical decision making, as well as reviewing the written report by the radiologist.  ED MD interpretation: I reviewed the CTA of the head and neck which is negative for acute dissection or other vessel pathology, incidental pulmonary nodule noted    ____________________________________________   PROCEDURES  Procedure(s) performed (including Critical Care):  Procedures   ____________________________________________   INITIAL IMPRESSION / ASSESSMENT AND PLAN / ED COURSE   65 year old female presenting with 2 days of neck pain.  On arrival to the ED patient is in significant distress she is  tearful.  Pain is located in the bilateral occipital regions radiating up to her head.  She has no associated neurologic findings.  Has no history of trauma or no similar pain in the past.  Her neurologic exam is within normal limits.  Given the significant pain and a 65 year old female will rule out cervical artery dissection by obtaining CT angio of the head and neck.  Low suspicion for bony pathology given she has no midline C-spine tenderness.  No evidence of cervical myelopathy given no other neurologic symptoms.  Treat supportively with morphine.  Patient has history of recent bleeding gastric ulcer so we will avoid NSAIDs.  Patient feeling much improved after morphine and Zofran.  CTA of the head and neck does not show  any obvious dissection or other pathology.  There is a incidentally noted 9 mm pulmonary nodule which I discussed with the patient.  Patient looks much more comfortable after morphine.  I suspect occipital neuralgia versus other musculoskeletal etiology of her pain.  Again I am reassured that she has no neurologic symptoms.  No further imaging indicated at this time.  Patient unfortunately cannot take NSAIDs.  We will discharge with a short course of Flexeril and oxycodone.  We discussed the side effects of these medications and to not combine them.  Patient will follow up with her primary care provider.  Clinical Course as of 03/16/21 0739  Mon Mar 16, 2021  [id number]  IMPRESSION: 1. Approximately 9 mm pulmonary nodule in the left upper lobe, possibly spiculated. Recommend CT of the chest for further evaluation. 2. No large vessel occlusion, proximal hemodynamically significant stenosis, or evidence of dissection in the head or neck.     [KM]    Clinical Course User Index [KM] Rada Hay, MD     ____________________________________________   FINAL CLINICAL IMPRESSION(S) / ED DIAGNOSES  Final diagnoses:  Neck pain  Pulmonary nodule     ED Discharge Orders           Ordered    cyclobenzaprine (FLEXERIL) 5 MG tablet  3 times daily PRN        03/16/21 0729    oxyCODONE (ROXICODONE) 5 MG immediate release tablet  Every 8 hours PRN        03/16/21 0729             Note:  This document was prepared using Dragon voice recognition software and may include unintentional dictation errors.    Rada Hay, MD 03/16/21 [phone]

## 2021-03-16 NOTE — ED Notes (Signed)
Pt given graham crackers with water.

## 2021-03-16 NOTE — ED Provider Notes (Addendum)
MSE was initiated and I personally evaluated the patient and placed orders (if any) at  5:32 AM on March 16, 2021.  The patient was identified as having severe neck pain during nursing triage.  She was brought to my attention and I saw her ASAP in a triage room.  Given the severity of the pain, I am concerned that she needs a bed on the main side for further evaluation and treatment.  I informed the charge nurse of my concerns and, in spite of ED boarding and limited staffing, we made a room for the patient immediately.  I personally helped the patient into the wheelchair and pushed her to the ED exam room.  While the room was being readied, I spoke by phone with the ED physician who would be providing care for her.  Dr. Starleen Blue met [place] in the room when we arrived.  The patient and her husband repeatedly expressed concern about the severity of her pain during the time we were getting her triaged and to her room, and I repeatedly reassured them that we were getting her to a room, establishing an IV, and providing treatment and further evaluation ASAP.   Hinda Kehr, MD 03/16/21 [phone]

## 2021-03-17 ENCOUNTER — Emergency Department
Admission: EM | Admit: 2021-03-17 | Discharge: 2021-03-17 | Disposition: A | Discharge status: Home / Self Care | Payer: BC Managed Care – PPO | Attending: Emergency Medicine | Admitting: Emergency Medicine

## 2021-03-17 ENCOUNTER — Ambulatory Visit: Payer: Self-pay | Admitting: *Deleted

## 2021-03-17 ENCOUNTER — Other Ambulatory Visit: Payer: Self-pay

## 2021-03-17 ENCOUNTER — Encounter: Payer: Self-pay | Admitting: Emergency Medicine

## 2021-03-17 ENCOUNTER — Telehealth: Payer: Self-pay

## 2021-03-17 DIAGNOSIS — M542 Cervicalgia: Secondary | ICD-10-CM | POA: Diagnosis not present

## 2021-03-17 DIAGNOSIS — I10 Essential (primary) hypertension: Secondary | ICD-10-CM | POA: Insufficient documentation

## 2021-03-17 MED ORDER — BACLOFEN 10 MG PO TABS: 10.0000 mg | ORAL_TABLET | Freq: Three times a day (TID) | ORAL | 0 refills | Status: DC

## 2021-03-17 MED ORDER — DEXAMETHASONE SODIUM PHOSPHATE 10 MG/ML IJ SOLN
10.0000 mg | Freq: Once | INTRAMUSCULAR | Status: AC
  Administered 2021-03-17: 10 mg via INTRAMUSCULAR
  Filled 2021-03-17: qty 1

## 2021-03-17 MED ORDER — BACLOFEN 10 MG PO TABS: 10.0000 mg | ORAL_TABLET | Freq: Three times a day (TID) | ORAL | 0 refills | Status: AC

## 2021-03-17 MED ORDER — DIAZEPAM 2 MG PO TABS
2.0000 mg | ORAL_TABLET | Freq: Once | ORAL | Status: AC
  Administered 2021-03-17: 2 mg via ORAL
  Filled 2021-03-17: qty 1

## 2021-03-17 NOTE — Telephone Encounter (Signed)
Reason for Disposition  [1] SEVERE neck pain (e.g., excruciating, unable to do any normal activities) AND [2] not improved after 2 hours of pain medicine  Answer Assessment - Initial Assessment Questions 1. ONSET: "When did the pain begin?"      Was seen in ED yesterday  2. LOCATION: "Where does it hurt?"      Neck  3. PATTERN "Does the pain come and go, or has it been constant since it started?"      Constant  4. SEVERITY: "How bad is the pain?"  (Scale 1-10; or mild, moderate, severe)   - NO PAIN (0): no pain or only slight stiffness    - MILD (1-3): doesn't interfere with normal activities    - MODERATE (4-7): interferes with normal activities or awakens from sleep    - SEVERE (8-10):  excruciating pain, unable to do any normal activities      Severe pain medication not effective  5. RADIATION: "Does the pain go anywhere else, shoot into your arms?"     Right hand and fingers N/T 6. CORD SYMPTOMS: "Any weakness or numbness of the arms or legs?"     Na  7. CAUSE: "What do you think is causing the neck pain?"     Not sure  8. NECK OVERUSE: "Any recent activities that involved turning or twisting the neck?"     na 9. OTHER SYMPTOMS: "Do you have any other symptoms?" (e.g., headache, fever, chest pain, difficulty breathing, neck swelling)     Right hand and fingers NT 10. PREGNANCY: "Is there any chance you are pregnant?" "When was your last menstrual period?"       na  Protocols used: Neck Pain or Stiffness-A-AH

## 2021-03-17 NOTE — ED Provider Notes (Signed)
[facility]-EMERGENCY DEPARTMENT  ____________________________________________  Time seen: Approximately 8:17 PM  I have reviewed the triage vital signs and the nursing notes.   HISTORY  Chief Complaint Neck Pain   Historian Patient     HPI Renee Patrick is a 65 y.o. female with a history of anxiety, hyperlipidemia and TIA, presents to the emergency department with neck pain.  Patient was seen and evaluated yesterday and had extensive work-up including CTA of the head and neck which showed no evidence of dissection, intracranial bleeding or other acute abnormality.  Patient was discharged home with Percocet and Flexeril.  Patient reports that she has been taking medications as directed but has returned as she felt better when she was having morphine in the emergency department yesterday.  She denies numbness or tingling in the upper and lower extremities.  No chest pain, chest tightness or abdominal pain.  Husband questions whether we can do neck injections in the emergency department.  Patient denies blurry vision or fever.  No dizziness.  No reports of altered mental status per husband.   Past Medical History:  Diagnosis Date   Anxiety    Hyperlipidemia    TIA (transient ischemic attack)      Immunizations up to date:  Yes.     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 Authorizing Provider  baclofen (LIORESAL)  10 MG tablet Take 1 tablet (10 mg total) by mouth 3 (three) times daily for 5 days. 03/17/21 03/22/21  Orvil Feil, PA-C  cyclobenzaprine (FLEXERIL) 5 MG tablet Take 1 tablet (5 mg total) by mouth 3 (three) times daily as needed for muscle spasms. 03/16/21   Georga Hacking, MD  linaclotide [facility]) 290 MCG CAPS capsule Take 1 capsule (290 mcg total) by mouth daily. 01/06/21   Wyline Mood, MD  ondansetron (ZOFRAN ODT) 4 MG disintegrating tablet Take 1 tablet (4 mg total) by mouth every 8 (eight) hours as needed for nausea or vomiting. 03/16/21   Georga Hacking, MD  oxyCODONE (ROXICODONE) 5 MG immediate release tablet Take 1 tablet (5 mg total) by mouth every 8 (eight) hours as needed for up to 5 days. 03/16/21 03/21/21  Georga Hacking, MD  sertraline (ZOLOFT) 100 MG tablet Take 100 mg by mouth daily. 05/28/20   [provider]    Allergies Patient has no known allergies.  Family History  Problem Relation Age of Onset   Ovarian cancer Mother    Lung cancer Father    Kidney disease Maternal Uncle    Hypertension Maternal Uncle     Social History Social History   Tobacco Use   Smoking status: Never   Smokeless tobacco: Never  Vaping Use   Vaping Use: Never used  Substance Use Topics   Alcohol use: Not Currently   Drug use: Never     Review of Systems  Constitutional: No fever/chills Eyes:  No discharge ENT: No upper respiratory complaints. Respiratory: no cough. No SOB/ use of accessory muscles to breath Gastrointestinal:   No nausea, no vomiting.  No diarrhea.  No constipation. Musculoskeletal: Patient has neck pain.  Skin: Negative for rash, abrasions, lacerations, ecchymosis.    ____________________________________________   PHYSICAL EXAM:  VITAL SIGNS: ED Triage Vitals  Enc Vitals Group     BP 03/17/21 1516 (!) 150/84     Pulse Rate 03/17/21 1516 81     Resp 03/17/21 1516 17     Temp 03/17/21 1516 98 F (36.7 C)     Temp Source 03/17/21  1516 Oral     SpO2 03/17/21 1516 95 %     Weight 03/17/21 1517 137 lb 9.6 oz (62.4 kg)     Height 03/17/21 1517 5' 1.25" (1.556 m)     Head Circumference --      Peak Flow --      Pain Score 03/17/21 1516 10     Pain Loc --      Pain Edu? --      Excl. in GC? --      Constitutional: Alert and oriented. Well appearing and in no acute distress. Eyes: Conjunctivae are normal. PERRL. EOMI. Head: Atraumatic. ENT:      Nose: No congestion/rhinnorhea.      Mouth/Throat: Mucous membranes are moist.  Neck: No stridor. Patient performs limited range of motion at the neck.  Cardiovascular: Normal rate, regular rhythm. Normal S1 and S2.  Good peripheral circulation. Respiratory: Normal respiratory effort without tachypnea or retractions. Lungs CTAB. Good air entry to the bases with no decreased or absent breath sounds Gastrointestinal: Bowel sounds x 4 quadrants. Soft and nontender to palpation. No guarding or rigidity. No distention. Musculoskeletal: Full range of motion to all extremities. No obvious deformities noted Neurologic:  Normal for age. No gross focal neurologic deficits are appreciated.  Skin:  Skin is warm, dry and intact. No rash noted. Psychiatric: Mood and affect are normal for age. Speech and behavior are normal.   ____________________________________________   LABS (all labs ordered are listed, but only abnormal results are displayed)  Labs Reviewed - No data to display ____________________________________________  EKG   ____________________________________________  RADIOLOGY   No results found.  ____________________________________________    PROCEDURES  Procedure(s) performed:     Procedures     Medications  diazepam (VALIUM) tablet 2 mg (2 mg Oral Given 03/17/21 1707)  dexamethasone (DECADRON) injection 10 mg (10 mg Intramuscular Given 03/17/21 1911)     ____________________________________________   INITIAL IMPRESSION / ASSESSMENT AND  PLAN / ED COURSE  Pertinent labs & imaging results that were available during my care of the patient were reviewed by me and considered in my medical decision making (see chart for details).    Assessment and plan Neck pain 65 year old female presents to the emergency department with persistent neck pain.  Patient was mildly hypertensive at triage but vital signs were otherwise reassuring.  She did perform limited range of motion of the neck.  I reviewed patient's work-up results with her from yesterday and explained that CTA head and neck were reassuring.  Patient states that she had approximately an hour and a half of relief after taking Percocet but states that her pain returned.  She requested a morphine drip.  Explained to patient that morphine was not conducive to outpatient management.  I tried 2 mg of Valium in the emergency department and patient seems sedated upon recheck.  She  stated that she did not notice any significant improvement in her symptoms.  Patient has a history of gastric ulcer and I did give patient 10 mg of IM Decadron.   I did change patient's muscle relaxer to baclofen as she stated that she had tried Flexeril at home without significant improvement.  I did tell patient in person and in discharge paperwork to discontinue Flexeril and not to take Flexeril and baclofen together.  I advised her to continue taking the Percocet as needed by Dr. Sidney Ace to use a warm compress, heating pad or warm sock filled with rice over the next several days.  Husband and patient feel comfortable with this plan at this time and will return with any new or worsening symptoms.      ____________________________________________  FINAL CLINICAL IMPRESSION(S) / ED DIAGNOSES  Final diagnoses:  Neck pain      NEW MEDICATIONS STARTED DURING THIS VISIT:  ED Discharge Orders          Ordered    baclofen (LIORESAL) 10 MG tablet  3 times daily,   Status:  Discontinued        03/17/21  1920    baclofen (LIORESAL) 10 MG tablet  3 times daily        03/17/21 1948                This chart was dictated using voice recognition software/Dragon. Despite best efforts to proofread, errors can occur which can change the meaning. Any change was purely unintentional.     Orvil Feil, PA-C 03/17/21 2022    Dionne Bucy, MD 03/20/21 [phone]

## 2021-03-17 NOTE — ED Triage Notes (Signed)
Pt comes into the ED via POV with her husband c/o neck pain that radiates up into the bottom of her head.  Pt states this has been ongoing since Sunday.  Pt was evaluated on Monday where we completed a CTA.  CTA was negative for any dissection at the time.  Pt in NAD with even and unlabored respirations and is neurologically intact and able to answer all questions.  Pt states the muscles relaxer's did not helps once she got discharged home.  Pt denies that they didn't give her any injections while she was here.

## 2021-03-17 NOTE — Telephone Encounter (Signed)
Pt husband called that pt went to ED for neck pain and they gave her pain med I advised him we can she her tomorrow for ED follow up pt is on bad pain they don't like to wait until tomorrow vanessa advised them to go to walk in [facility] and pt was agreed

## 2021-03-17 NOTE — Telephone Encounter (Signed)
C/o severe pain in neck and now feels N/T in right hand and fingers. Seen in ED yesterday and has attempted to take oxycodone alternating tylenol and flexeril for pain and has had no relief. Pain reported as increased since last night. Patient has contacted PCP and no return calls at this time. Recommended ED now due to severe pain and inability to do normal activities. Care advise given. Patient and husband verbalized understanding of care advise and to go to ED now or call 911 if symptoms worsen.

## 2021-03-17 NOTE — Discharge Instructions (Addendum)
Take Baclofen up to three times daily for five days.  Continue to take Roxicet for pain as directed by Dr. Sidney Ace

## 2021-03-18 ENCOUNTER — Telehealth: Payer: Self-pay

## 2021-03-18 ENCOUNTER — Other Ambulatory Visit: Payer: Self-pay | Admitting: Physician Assistant

## 2021-03-18 DIAGNOSIS — N3000 Acute cystitis without hematuria: Secondary | ICD-10-CM

## 2021-03-18 DIAGNOSIS — R768 Other specified abnormal immunological findings in serum: Secondary | ICD-10-CM

## 2021-03-18 DIAGNOSIS — B379 Candidiasis, unspecified: Secondary | ICD-10-CM

## 2021-03-18 DIAGNOSIS — M064 Inflammatory polyarthropathy: Secondary | ICD-10-CM

## 2021-03-18 MED ORDER — NITROFURANTOIN MONOHYD MACRO 100 MG PO CAPS: ORAL_CAPSULE | ORAL | 0 refills | Status: DC

## 2021-03-18 MED ORDER — FLUCONAZOLE 150 MG PO TABS: 150.0000 mg | ORAL_TABLET | Freq: Once | ORAL | 0 refills | Status: AC

## 2021-03-18 NOTE — Telephone Encounter (Signed)
Called patient no answer,  LMOM for pt and informed her of lab results and if any questions to call back on Monday after thanksgiving.  Informed pt that we sent antibiotics and diflucan to pharmacy for UTI and yeast infection.

## 2021-03-18 NOTE — Telephone Encounter (Signed)
-----   Message from Carlean Jews, PA-C sent at 03/18/2021 12:52 PM EST ----- Please let pt know she has a UTI and yeast infection and I am sending in an antibiotic and diflucan for her. Also her vit D is low and she should supplement OTC. Her pap was negative. Her cholesterol is high and I would like to start her on a statin and have her improve diet and exercise. Can send 5mg  crestor if she agrees to this. Also her ANA was positive and can be indicative of an autoimmune condition therefore I will refer her to rheumatology

## 2021-03-23 ENCOUNTER — Telehealth: Payer: Self-pay

## 2021-03-23 NOTE — Telephone Encounter (Signed)
Lvm to return my call regarding rheumatology referral-Toni

## 2021-03-25 ENCOUNTER — Other Ambulatory Visit: Payer: Self-pay

## 2021-03-25 ENCOUNTER — Ambulatory Visit
Admission: RE | Admit: 2021-03-25 | Discharge: 2021-03-25 | Disposition: A | Discharge status: Home / Self Care | Payer: BC Managed Care – PPO | Source: Ambulatory Visit | Attending: Family Medicine | Admitting: Family Medicine

## 2021-03-25 DIAGNOSIS — M5416 Radiculopathy, lumbar region: Secondary | ICD-10-CM | POA: Insufficient documentation

## 2021-03-25 IMAGING — MR MR LUMBAR SPINE W/O CM
5 series · 31 of 48 positions shown · non-contrast
Comparison: None.

CLINICAL DATA: Low back pain for 10 years.

EXAM:
MRI LUMBAR SPINE WITHOUT CONTRAST
TECHNIQUE: Multiplanar, multisequence MR imaging of the lumbar spine was
performed. No intravenous contrast was administered.

[Series 5: T2 · sagittal · 4.0mm · 0.81mm/px · 6 of 17 slices shown (1 of 2)]
[im 1/17]
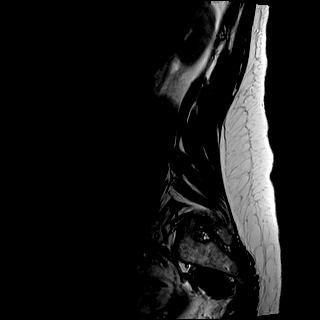
[im 4/17]
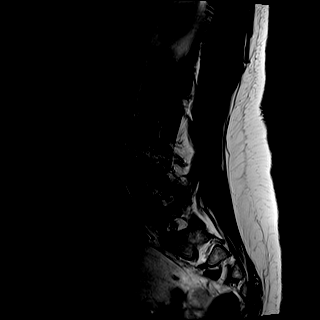
[im 7/17]
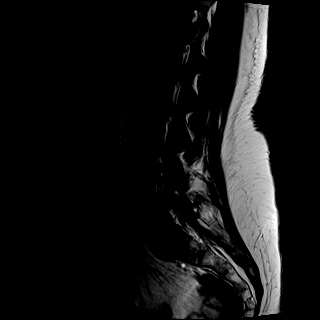
[im 10/17]
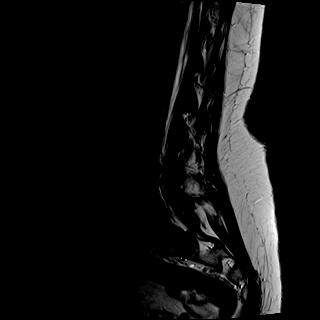
[im 13/17]
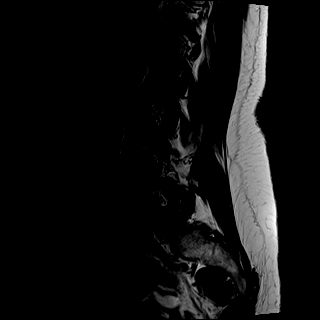
[im 17/17]
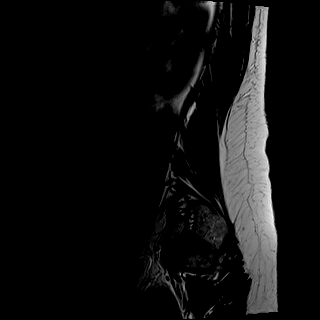

[Series 6: T1 · sagittal · 4.0mm · 0.81mm/px · 7 of 17 slices shown (1 of 2)]
[im 1/17]
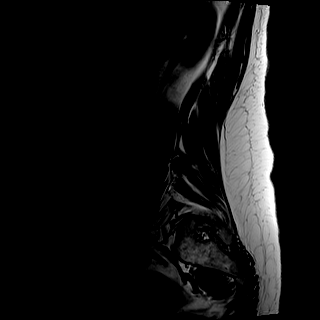
[im 3/17]
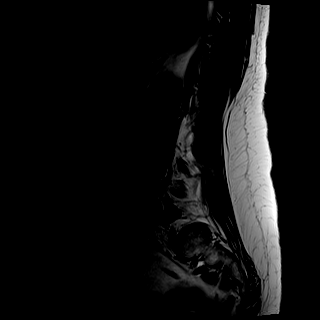
[im 6/17]
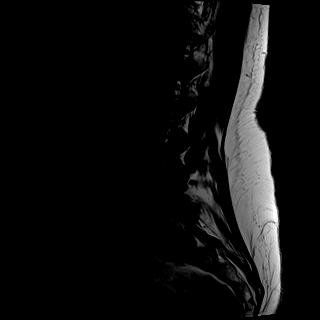
[im 9/17]
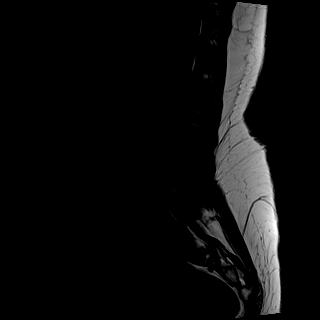
[im 11/17]
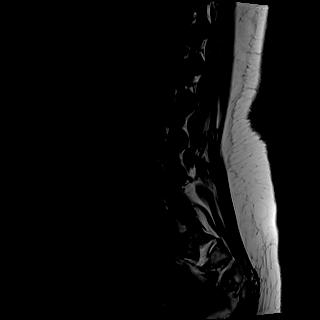
[im 14/17]
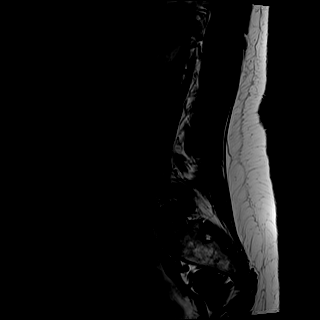
[im 17/17]
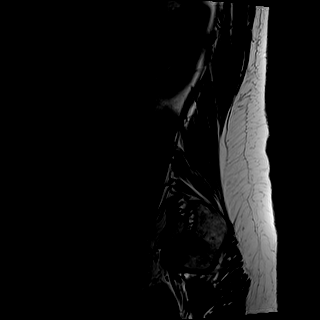

[Series 7: STIR · sagittal · 4.0mm · 0.41mm/px · 2 of 17 slices shown]
[im 1/17]
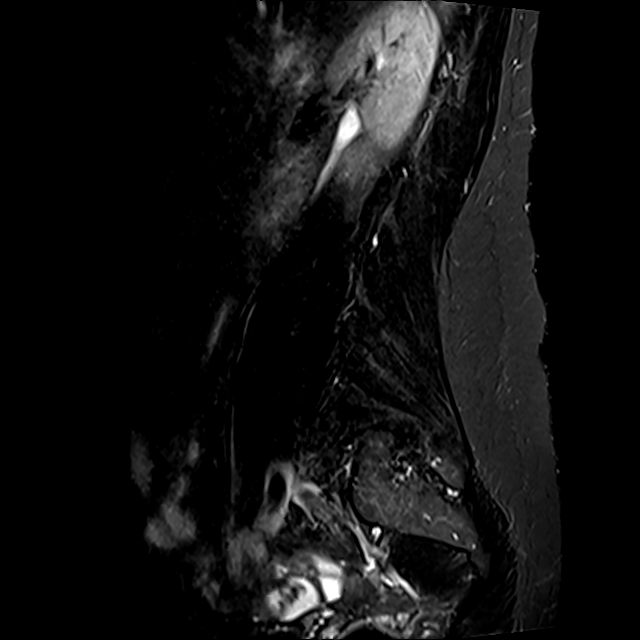
[im 3/17]
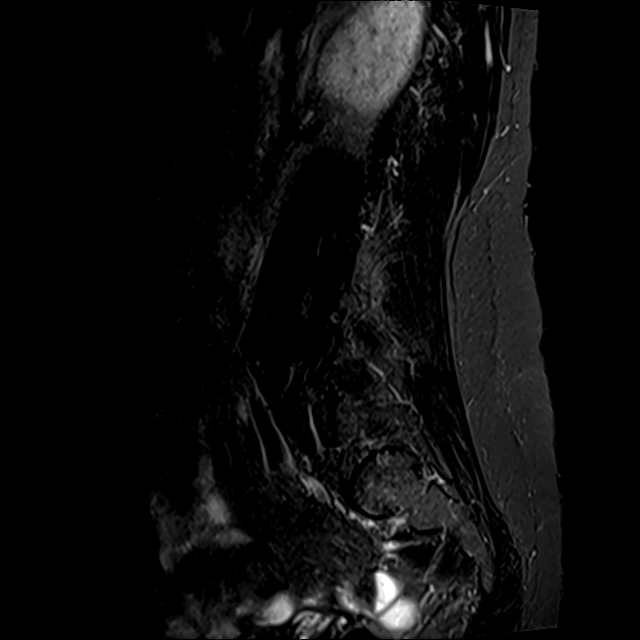

[Series 8: T2 · axial · 4.0mm · 0.78mm/px · z∈[-146,+55]mm · 8 of 33 slices shown (2 of 2)]
[im 1/33]
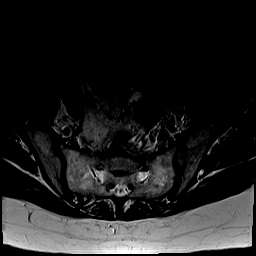
[im 5/33]
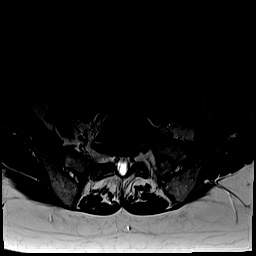
[im 10/33]
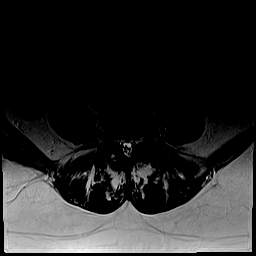
[im 15/33]
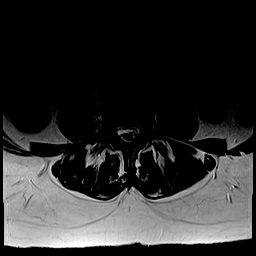
[im 18/33]
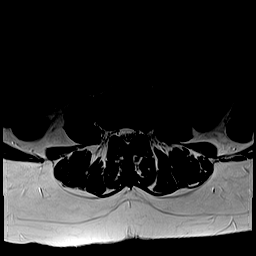
[im 23/33]
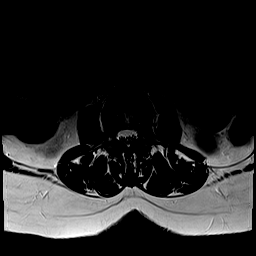
[im 28/33]
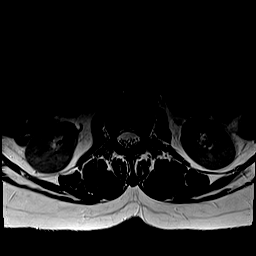
[im 33/33]
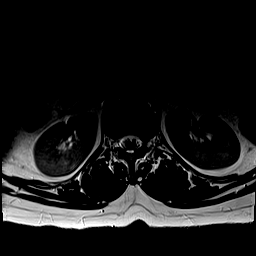

[Series 9: T1 · axial · 4.0mm · 0.39mm/px · z∈[-146,+55]mm · 8 of 33 slices shown (2 of 2)]
[im 1/33]
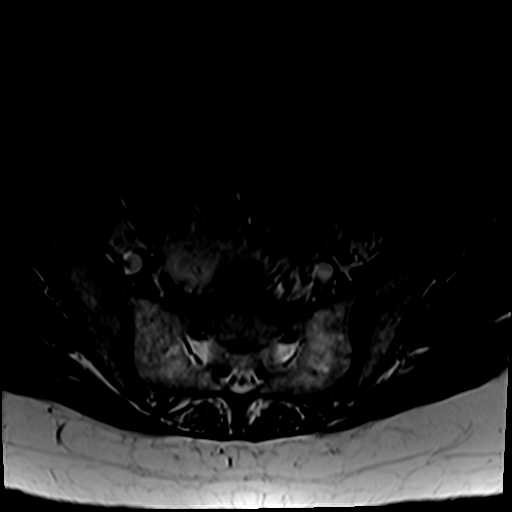
[im 5/33]
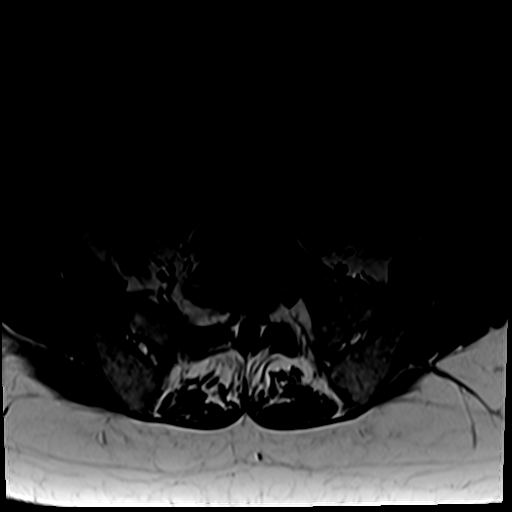
[im 10/33]
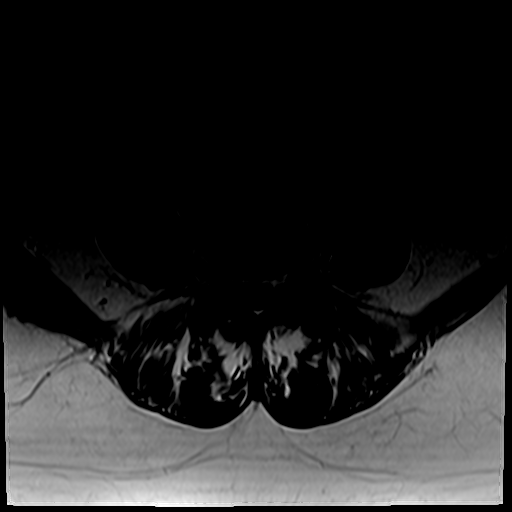
[im 15/33]
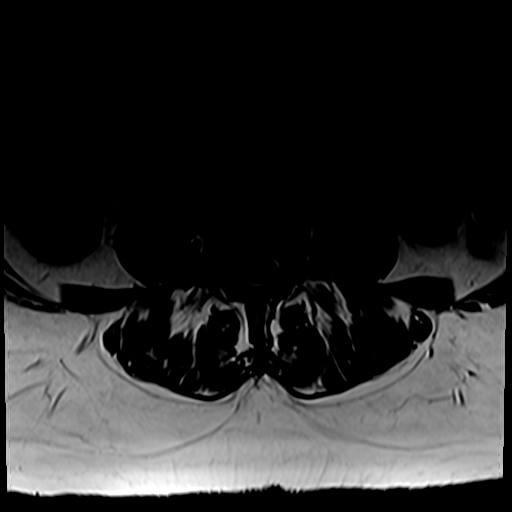
[im 18/33]
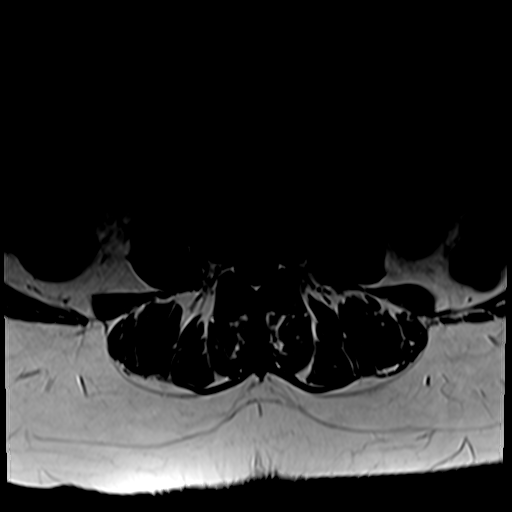
[im 23/33]
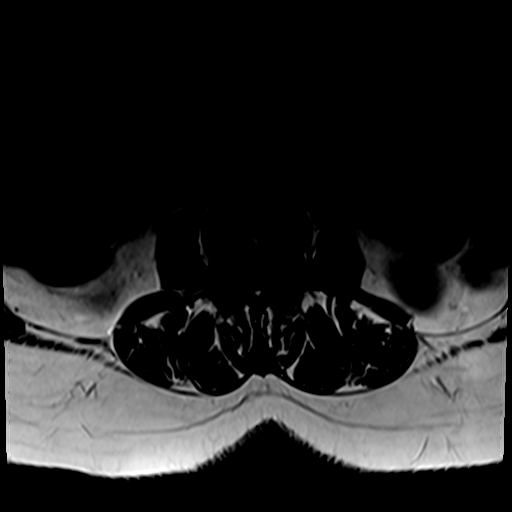
[im 28/33]
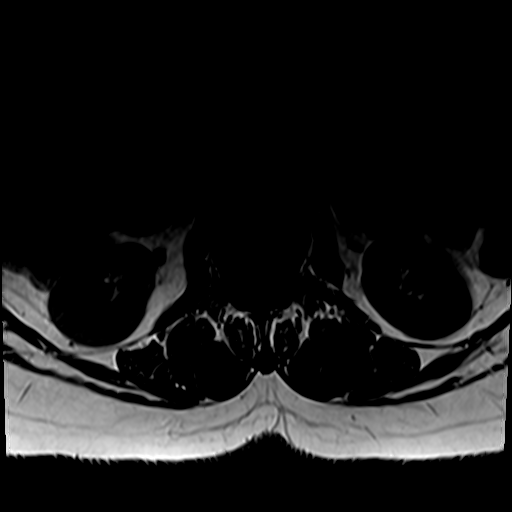
[im 33/33]
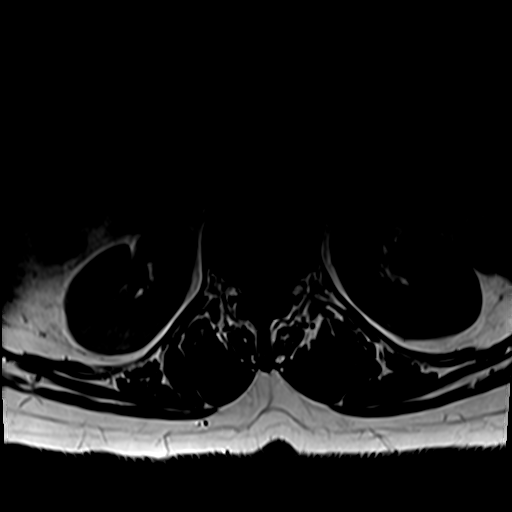

[31 of 48 positions shown; findings below may reference images not displayed]

FINDINGS: Segmentation: Probable transitional anatomy at the lumbosacral
junction. For the purposes of this dictation, there is a partially
sacralized L5 and rudimentary L5-S1 disc.

Alignment:  No significant listhesis.

Vertebrae: Degenerative endplate irregularity at L3-L4. No
substantial marrow edema. No suspicious osseous lesion.

Conus medullaris and cauda equina: Conus extends to the L1 level.
Conus and cauda equina appear normal.

Paraspinal and other soft tissues: Unremarkable.

Disc levels:

L1-L2:  No canal or foraminal stenosis.

L2-L3:  No canal or foraminal stenosis.

L3-L4: Disc desiccation and height loss. Disc bulge with endplate
osteophytic ridging. Moderate facet arthropathy with ligamentum
flavum infolding. Marked canal stenosis with effacement of
subarticular recesses. Mild to moderate right and minor left
foraminal stenosis.

L4-L5: Disc bulge. Marked facet arthropathy with ligamentum flavum
infolding and small extra-spinal synovial cyst formation. Moderate
canal stenosis with partial effacement of the subarticular recesses.
Mild foraminal stenosis, right greater than left.

L5-S1:  Rudimentary disc.  No stenosis.
IMPRESSION: Degenerative changes as detailed above, primarily at L3-L4 and
L4-L5.

## 2021-03-30 NOTE — Telephone Encounter (Signed)
Have not been able to fax referral to Rheumatology office since their fax machine is not working. Emailed referral to [email or web address] per office-Toni

## 2021-04-02 NOTE — Telephone Encounter (Signed)
Left office message to return my call to see if they received referral-Toni

## 2021-04-06 NOTE — Telephone Encounter (Signed)
Manually faxed to [facility] [phone]

## 2021-05-04 ENCOUNTER — Encounter: Payer: Self-pay | Admitting: Internal Medicine

## 2021-05-04 ENCOUNTER — Other Ambulatory Visit: Payer: Self-pay

## 2021-05-04 ENCOUNTER — Ambulatory Visit (INDEPENDENT_AMBULATORY_CARE_PROVIDER_SITE_OTHER): Payer: Medicare Other

## 2021-05-04 ENCOUNTER — Ambulatory Visit: Payer: Medicare Other | Admitting: Internal Medicine

## 2021-05-04 VITALS — BP 126/71 | HR 72 | Temp 97.9°F | Resp 16 | Ht 61.25 in | Wt 139.0 lb

## 2021-05-04 DIAGNOSIS — R911 Solitary pulmonary nodule: Secondary | ICD-10-CM | POA: Diagnosis not present

## 2021-05-04 DIAGNOSIS — R918 Other nonspecific abnormal finding of lung field: Secondary | ICD-10-CM | POA: Diagnosis not present

## 2021-05-04 DIAGNOSIS — R300 Dysuria: Secondary | ICD-10-CM

## 2021-05-04 DIAGNOSIS — R0609 Other forms of dyspnea: Secondary | ICD-10-CM

## 2021-05-04 DIAGNOSIS — B389 Coccidioidomycosis, unspecified: Secondary | ICD-10-CM

## 2021-05-04 LAB — POCT URINALYSIS DIPSTICK
Bilirubin, UA: NEGATIVE
Glucose, UA: NEGATIVE
Ketones, UA: NEGATIVE
Leukocytes, UA: NEGATIVE
Nitrite, UA: NEGATIVE
Protein, UA: NEGATIVE
Spec Grav, UA: >=1.03 — AB (ref 1.010–1.025)
Urobilinogen, UA: NEGATIVE E.U./dL — AB
pH, UA: 5 (ref 5.0–8.0)

## 2021-05-04 NOTE — Progress Notes (Signed)
[facility] [place], [place] [address]  Pulmonary Sleep Medicine   Office Visit Note  Patient Name: Renee Patrick DOB: [birth year] MRN [id number]  Date of Service: 05/04/2021  Complaints/HPI: Spots on my Lung. She states she has recently moved here from [place]. She states that she lived there for 35 years. Apparently she had a CXR done in [place] and was told she has spots in her lungs. She states that she has noted no cough no congestion. No hemoptysis. No fevers or chills. She denies having any weight loss in fact she has gained weight. She has no exposure to TB. She states her parents had lung cancer. Her father was a smoker. She has never smoked or done drugs and has never been a drinker. She states has no recent travel other than [place] and a [place] trip last year. She denies having any other medical problems  ROS  General: (-) fever, (-) chills, (-) night sweats, (-) weakness Skin: (-) rashes, (-) itching,. Eyes: (-) visual changes, (-) redness, (-) itching. Nose and Sinuses: (-) nasal stuffiness or itchiness, +postnasal drip, (-) nosebleeds, (-) sinus trouble. Mouth and Throat: (-) sore throat, (-) hoarseness. Neck: (-) swollen glands, (-) enlarged thyroid, (-) neck pain. Respiratory: - cough, (-) bloody sputum, - shortness of breath, - wheezing. Cardiovascular: - ankle swelling, (-) chest pain. Lymphatic: (-) lymph node enlargement. Neurologic: (-) numbness, (-) tingling. Psychiatric: (-) anxiety, (-) depression   Current Medication: Outpatient Encounter Medications as of 05/04/2021  Medication Sig   aspirin 81 MG EC tablet Take by mouth.   baclofen (LIORESAL) 10 MG tablet Take 10 mg by mouth 2 (two) times daily.   cyclobenzaprine (FLEXERIL) 5 MG tablet Take 1 tablet (5 mg total) by mouth 3 (three) times daily as needed for muscle spasms.   linaclotide (LINZESS) 290 MCG CAPS capsule Take 1 capsule (290 mcg total) by mouth daily.    ondansetron (ZOFRAN ODT) 4 MG disintegrating tablet Take 1 tablet (4 mg total) by mouth every 8 (eight) hours as needed for nausea or vomiting.   pantoprazole (PROTONIX) 40 MG tablet Take 40 mg by mouth daily.   sertraline (ZOLOFT) 100 MG tablet Take 100 mg by mouth daily.   [DISCONTINUED] nitrofurantoin, macrocrystal-monohydrate, (MACROBID) 100 MG capsule Take 1 cap twice per day for 10 days. (Patient not taking: Reported on 05/04/2021)   No facility-administered encounter medications on file as of 05/04/2021.    Surgical History: Past Surgical History:  Procedure Laterality Date   [place]    Medical History: Past Medical History:  Diagnosis Date   Anxiety    Hyperlipidemia    TIA (transient ischemic attack)     Family History: Family History  Problem Relation Age of Onset   Ovarian cancer Mother    Lung cancer Father    Kidney disease Maternal Uncle    Hypertension Maternal Uncle     Social History: Social History   Socioeconomic History   Marital status: Married    Spouse name: Not on file   Number of children: Not on file   Years of education: Not on file   Highest education level: Not on file  Occupational History   Not on file  Tobacco Use   Smoking status: Never   Smokeless tobacco: Never  Vaping Use   Vaping Use: Never used  Substance and Sexual Activity   Alcohol use: Not Currently   Drug use: Never  Sexual activity: Not on file  Other Topics Concern   Not on file  Social History Narrative   Not on file   Social Determinants of Health   Financial Resource Strain: Not on file  Food Insecurity: Not on file  Transportation Needs: Not on file  Physical Activity: Not on file  Stress: Not on file  Social Connections: Not on file  Intimate Partner Violence: Not on file    Vital Signs: Blood pressure 126/71, pulse 72, temperature 97.9 F (36.6 C), resp. rate 16, height 5' 1.25" (1.556 m), weight 139 lb (63  kg), SpO2 98 %.  Examination: General Appearance: The patient is well-developed, well-nourished, and in no distress. Skin: Gross inspection of skin unremarkable. Head: normocephalic, no gross deformities. Eyes: no gross deformities noted. ENT: ears appear grossly normal no exudates. Neck: Supple. No thyromegaly. No LAD. Respiratory: no rhonchi noted at this time. Cardiovascular: Normal S1 and S2 without murmur or rub. Extremities: No cyanosis. pulses are equal. Neurologic: Alert and oriented. No involuntary movements.  LABS: Recent Results (from the past 2160 hour(s))  CBC w/Diff/Platelet     Status: None   Collection Time: 03/05/21  1:20 PM  Result Value Ref Range   WBC 7.7 3.4 - 10.8 x10E3/uL   RBC 4.58 3.77 - 5.28 x10E6/uL   Hemoglobin 12.8 11.1 - 15.9 g/dL   Hematocrit 38.6 34.0 - 46.6 %   MCV 84 79 - 97 fL   MCH 27.9 26.6 - 33.0 pg   MCHC 33.2 31.5 - 35.7 g/dL   RDW 12.3 11.7 - 15.4 %   Platelets 257 150 - 450 x10E3/uL   Neutrophils 60 Not Estab. %   Lymphs 29 Not Estab. %   Monocytes 9 Not Estab. %   Eos 1 Not Estab. %   Basos 1 Not Estab. %   Neutrophils Absolute 4.6 1.4 - 7.0 x10E3/uL   Lymphocytes Absolute 2.2 0.7 - 3.1 x10E3/uL   Monocytes Absolute 0.7 0.1 - 0.9 x10E3/uL   EOS (ABSOLUTE) 0.1 0.0 - 0.4 x10E3/uL   Basophils Absolute 0.1 0.0 - 0.2 x10E3/uL   Immature Granulocytes 0 Not Estab. %   Immature Grans (Abs) 0.0 0.0 - 0.1 x10E3/uL  Comprehensive metabolic panel     Status: Abnormal   Collection Time: 03/05/21  1:20 PM  Result Value Ref Range   Glucose 94 70 - 99 mg/dL   BUN 11 8 - 27 mg/dL   Creatinine, Ser 0.88 0.57 - 1.00 mg/dL   eGFR 73 >59 mL/min/1.73   BUN/Creatinine Ratio 13 12 - 28   Sodium 145 (H) 134 - 144 mmol/L   Potassium 4.2 3.5 - 5.2 mmol/L   Chloride 104 96 - 106 mmol/L   CO2 25 20 - 29 mmol/L   Calcium 9.7 8.7 - 10.3 mg/dL   Total Protein 7.0 6.0 - 8.5 g/dL   Albumin 4.7 3.8 - 4.8 g/dL   Globulin, Total 2.3 1.5 - 4.5 g/dL    Albumin/Globulin Ratio 2.0 1.2 - 2.2   Bilirubin Total 0.5 0.0 - 1.2 mg/dL   Alkaline Phosphatase 78 44 - 121 IU/L   AST 24 0 - 40 IU/L   ALT 11 0 - 32 IU/L  Lipid Panel With LDL/HDL Ratio     Status: Abnormal   Collection Time: 03/05/21  1:20 PM  Result Value Ref Range   Cholesterol, Total 267 (H) 100 - 199 mg/dL   Triglycerides 191 (H) 0 - 149 mg/dL   HDL 47 >39 mg/dL  VLDL Cholesterol Cal 36 5 - 40 mg/dL   LDL Chol Calc (NIH) 184 (H) 0 - 99 mg/dL   LDL/HDL Ratio 3.9 (H) 0.0 - 3.2 ratio    Comment:                                     LDL/HDL Ratio                                             Men  Women                               1/2 Avg.Risk  1.0    1.5                                   Avg.Risk  3.6    3.2                                2X Avg.Risk  6.2    5.0                                3X Avg.Risk  8.0    6.1   TSH + free T4     Status: None   Collection Time: 03/05/21  1:20 PM  Result Value Ref Range   TSH 0.763 0.450 - 4.500 uIU/mL   Free T4 1.22 0.82 - 1.77 ng/dL  B12 and Folate Panel     Status: Abnormal   Collection Time: 03/05/21  1:20 PM  Result Value Ref Range   Vitamin B-12 1,676 (H) 232 - 1,245 pg/mL   Folate >20.0 >3.0 ng/mL    Comment: A serum folate concentration of less than 3.1 ng/mL is considered to represent clinical deficiency.   VITAMIN D 25 Hydroxy (Vit-D Deficiency, Fractures)     Status: Abnormal   Collection Time: 03/05/21  1:20 PM  Result Value Ref Range   Vit D, 25-Hydroxy 27.5 (L) 30.0 - 100.0 ng/mL    Comment: Vitamin D deficiency has been defined by the [place] practice guideline as a level of serum 25-OH vitamin D less than 20 ng/mL (1,2). The Endocrine Society went on to further define vitamin D insufficiency as a level between 21 and 29 ng/mL (2). 1. IOM (Institute of Medicine). 2010. Dietary reference    intakes for calcium and D. [place]: The    [employer]. 2. Holick  MF, Binkley [place], Bischoff-Ferrari HA, et al.    Evaluation, treatment, and prevention of vitamin D    deficiency: an Endocrine Society clinical practice    guideline. JCEM. 2011 Jul; 96(7):1911-30.   Rheumatoid Factor     Status: None   Collection Time: 03/05/21  1:20 PM  Result Value Ref Range   Rhuematoid fact SerPl-aCnc <10.0 <14.0 IU/mL  ANA w/Reflex if Positive     Status: Abnormal   Collection Time: 03/05/21  1:20 PM  Result Value Ref Range   Anti Nuclear Antibody (ANA) Positive (A) Negative   dsDNA Ab  1 0 - 9 IU/mL    Comment:                                    Negative      <5                                    Equivocal  5 - 9                                    Positive      >9    ENA RNP Ab 0.2 0.0 - 0.9 AI   ENA SM Ab Ser-aCnc <0.2 0.0 - 0.9 AI   Scleroderma (Scl-70) (ENA) Antibody, IgG <0.2 0.0 - 0.9 AI   ENA SSA (RO) Ab 1.1 (H) 0.0 - 0.9 AI   ENA SSB (LA) Ab <0.2 0.0 - 0.9 AI   Chromatin Ab SerPl-aCnc <0.2 0.0 - 0.9 AI   Anti JO-1 <0.2 0.0 - 0.9 AI   Centromere Ab Screen <0.2 0.0 - 0.9 AI   See below: Comment     Comment: Autoantibody                       Disease Association ------------------------------------------------------------                         Condition                  Frequency ---------------------   ------------------------   --------- Antinuclear Antibody,    SLE, mixed connective Direct (ANA-D)           tissue diseases ---------------------   ------------------------   --------- dsDNA                    SLE                        40 - 60% ---------------------   ------------------------   --------- Chromatin                Drug induced SLE                90%                          SLE                        48 - 97% ---------------------   ------------------------   --------- SSA (Ro)                 SLE                        25 - 35%                          Sjogren's Syndrome         40 - 70%                          Neonatal Lupus  100% ---------------------   ------------------------   --------- SSB (La)                 SLE                              10%                          Sjogren's Syndrome              30% ---------------------   -----------------------    --------- Sm (anti-Smith)          SLE                        15 - 30% ---------------------   -----------------------    --------- RNP                      Mixed Connective Tissue                          Disease                         95% (U1 nRNP,                SLE                        30 - 50% anti-ribonucleoprotein)  Polymyositis and/or                          Dermatomyositis                 20% ---------------------   ------------------------   --------- Scl-70 (antiDNA          Scleroderma (diffuse)      20 - 35% topoisomerase)           Crest                           13% ---------------------   ------------------------   --------- Jo-1                     Polymyositis and/or                          Dermatomyositis            20 - 40% ---------------------   ------------------------   --------- Centromere B             Scleroderma -  Crest                          variant                         80%   IGP, Aptima HPV     Status: None   Collection Time: 03/05/21  2:39 PM  Result Value Ref Range   Interpretation NILM     Comment: NEGATIVE FOR INTRAEPITHELIAL LESION OR MALIGNANCY.   Category NIL     Comment: Negative for Intraepithelial Lesion   Adequacy SECNI     Comment: Satisfactory for evaluation. No endocervical component is  identified.   Clinician Provided ICD10 Comment     Comment: Z12.4   Performed by: Comment     Comment: Krystal Eaton, Cytotechnologist (ASCP)   Note: Comment     Comment: The Pap smear is a screening test designed to aid in the detection of premalignant and malignant conditions of the uterine cervix.  It is not a diagnostic procedure and should not be used as the sole means of detecting cervical  cancer.  Both false-positive and false-negative reports do occur.    Test Methodology Comment     Comment: This liquid based ThinPrep(R) pap test was screened with the use of an image guided system.    HPV Aptima Negative Negative    Comment: This nucleic acid amplification test detects fourteen high-risk HPV types (16,18,31,33,35,39,45,51,52,56,58,59,66,68) without differentiation.   UA/M w/rflx Culture, Routine     Status: Abnormal   Collection Time: 03/05/21  2:39 PM   Specimen: Urine   Urine  Result Value Ref Range   Specific Gravity, UA 1.014 1.005 - 1.030   pH, UA 6.0 5.0 - 7.5   Color, UA Yellow Yellow   Appearance Ur Cloudy (A) Clear   Leukocytes,UA Negative Negative   Protein,UA Negative Negative/Trace   Glucose, UA Negative Negative   Ketones, UA Negative Negative   RBC, UA Negative Negative   Bilirubin, UA Negative Negative   Urobilinogen, Ur 0.2 0.2 - 1.0 mg/dL   Nitrite, UA Negative Negative   Microscopic Examination Comment     Comment: Microscopic follows if indicated.   Microscopic Examination See below:     Comment: Microscopic was indicated and was performed.   Urinalysis Reflex Comment     Comment: This specimen has reflexed to a Urine Culture.  NuSwab Vaginitis Plus (VG+)     Status: Abnormal   Collection Time: 03/05/21  2:39 PM  Result Value Ref Range   Atopobium vaginae Low - 0 Score   BVAB 2 Low - 0 Score   Megasphaera 1 Low - 0 Score    Comment: Calculate total score by adding the 3 individual bacterial vaginosis (BV) marker scores together.  Total score is interpreted as follows: Total score 0-1: Indicates the absence of BV. Total score   2: Indeterminate for BV. Additional clinical                  data should be evaluated to establish a                  diagnosis. Total score 3-6: Indicates the presence of BV. This test was developed and its performance characteristics determined by [facility].  It has not been cleared or approved by the Food  and Drug Administration.    Candida albicans, NAA Positive (A) Negative   Candida glabrata, NAA Negative Negative   Trich vag by NAA Negative Negative   Chlamydia trachomatis, NAA Negative Negative   Neisseria gonorrhoeae, NAA Negative Negative  Microscopic Examination     Status: Abnormal   Collection Time: 03/05/21  2:39 PM   Urine  Result Value Ref Range   WBC, UA 6-10 (A) 0 - 5 /hpf   RBC None seen 0 - 2 /hpf   Epithelial Cells (non renal) None seen 0 - 10 /hpf   Casts None seen None seen /lpf   Bacteria, UA Many (A) None seen/Few  Urine Culture, Reflex     Status: Abnormal   Collection Time: 03/05/21  2:39 PM   Urine  Result Value Ref  Range   Urine Culture, Routine Final report (A)    Organism ID, Bacteria Klebsiella pneumoniae (A)     Comment: Cefazolin <=4 ug/mL Cefazolin with an MIC <=16 predicts susceptibility to the oral agents cefaclor, cefdinir, cefpodoxime, cefprozil, cefuroxime, cephalexin, and loracarbef when used for therapy of uncomplicated urinary tract infections due to E. coli, Klebsiella pneumoniae, and Proteus mirabilis. Greater than 100,000 colony forming units per mL    Antimicrobial Susceptibility Comment     Comment:       ** S = Susceptible; I = Intermediate; R = Resistant **                    P = Positive; N = Negative             MICS are expressed in micrograms per mL    Antibiotic                 RSLT#1    RSLT#2    RSLT#3    RSLT#4 Amoxicillin/Clavulanic Acid    S Ampicillin                     R Cefepime                       S Ceftriaxone                    S Cefuroxime                     S Ciprofloxacin                  S Ertapenem                      S Gentamicin                     S Imipenem                       S Levofloxacin                   S Meropenem                      S Nitrofurantoin                 S Piperacillin/Tazobactam        S Tetracycline                   S Tobramycin                      S Trimethoprim/Sulfa             S   Comprehensive metabolic panel     Status: Abnormal   Collection Time: 03/16/21  5:56 AM  Result Value Ref Range   Sodium 139 135 - 145 mmol/L   Potassium 3.4 (L) 3.5 - 5.1 mmol/L   Chloride 105 98 - 111 mmol/L   CO2 25 22 - 32 mmol/L   Glucose, Bld 123 (H) 70 - 99 mg/dL    Comment: Glucose reference range applies only to samples taken after fasting for at least 8 hours.   BUN 11 8 - 23 mg/dL   Creatinine, Ser 0.80 0.44 - 1.00 mg/dL   Calcium 9.3 8.9 - 10.3 mg/dL   Total  Protein 7.2 6.5 - 8.1 g/dL   Albumin 4.0 3.5 - 5.0 g/dL   AST 26 15 - 41 U/L   ALT 15 0 - 44 U/L   Alkaline Phosphatase 68 38 - 126 U/L   Total Bilirubin 0.8 0.3 - 1.2 mg/dL   GFR, Estimated >60 >60 mL/min    Comment: (NOTE) Calculated using the CKD-EPI Creatinine Equation (2021)    Anion gap 9 5 - 15    Comment: Performed at [facility], [address] [place], [place] [address]  CBC with Differential     Status: Abnormal   Collection Time: 03/16/21  5:56 AM  Result Value Ref Range   WBC 9.9 4.0 - 10.5 K/uL   RBC 4.28 3.87 - 5.11 MIL/uL   Hemoglobin 11.8 (L) 12.0 - 15.0 g/dL   HCT 37.0 36.0 - 46.0 %   MCV 86.4 80.0 - 100.0 fL   MCH 27.6 26.0 - 34.0 pg   MCHC 31.9 30.0 - 36.0 g/dL   RDW 12.4 11.5 - 15.5 %   Platelets 228 150 - 400 K/uL   nRBC 0.0 0.0 - 0.2 %   Neutrophils Relative % 67 %   Neutro Abs 6.5 1.7 - 7.7 K/uL   Lymphocytes Relative 23 %   Lymphs Abs 2.3 0.7 - 4.0 K/uL   Monocytes Relative 9 %   Monocytes Absolute 0.9 0.1 - 1.0 K/uL   Eosinophils Relative 1 %   Eosinophils Absolute 0.1 0.0 - 0.5 K/uL   Basophils Relative 0 %   Basophils Absolute 0.0 0.0 - 0.1 K/uL   Immature Granulocytes 0 %   Abs Immature Granulocytes 0.03 0.00 - 0.07 K/uL    Comment: Performed at [facility], [address] [place], [place] [address]    Radiology: MR LUMBAR SPINE WO CONTRAST  Result Date: 03/26/2021 CLINICAL DATA:  Low back pain for 10  years. EXAM: MRI LUMBAR SPINE WITHOUT CONTRAST TECHNIQUE: Multiplanar, multisequence MR imaging of the lumbar spine was performed. No intravenous contrast was administered. COMPARISON:  None. FINDINGS: Segmentation: Probable transitional anatomy at the lumbosacral junction. For the purposes of this dictation, there is a partially sacralized L5 and rudimentary L5-S1 disc. Alignment:  No significant listhesis. Vertebrae: Degenerative endplate irregularity at L3-L4. No substantial marrow edema. No suspicious osseous lesion. Conus medullaris and cauda equina: Conus extends to the L1 level. Conus and cauda equina appear normal. Paraspinal and other soft tissues: Unremarkable. Disc levels: L1-L2:  No canal or foraminal stenosis. L2-L3:  No canal or foraminal stenosis. L3-L4: Disc desiccation and height loss. Disc bulge with endplate osteophytic ridging. Moderate facet arthropathy with ligamentum flavum infolding. Marked canal stenosis with effacement of subarticular recesses. Mild to moderate right and minor left foraminal stenosis. L4-L5: Disc bulge. Marked facet arthropathy with ligamentum flavum infolding and small extra-spinal synovial cyst formation. Moderate canal stenosis with partial effacement of the subarticular recesses. Mild foraminal stenosis, right greater than left. L5-S1:  Rudimentary disc.  No stenosis. IMPRESSION: Degenerative changes as detailed above, primarily at L3-L4 and L4-L5. Electronically Signed   By: Macy Mis M.D.   On: 03/26/2021 13:49    No results found.  No results found.    Assessment and Plan: Patient Active Problem List   Diagnosis Date Noted   HTN (hypertension) 08/05/2020   Precordial pain 08/05/2020   Hyperlipidemia 08/01/2020   Irritable bowel syndrome 08/01/2020   Mixed anxiety depressive disorder 08/01/2020   Primary osteoarthritis of both knees 08/01/2020   Spondylosis of  lumbar spine 08/01/2020    Pulmonary Nodules likely may be related to "valley  fever" which she may have had in the past. With a family history of lung cancer I would still like to image her with a CT scan in the setting of an abnormal CXR Coccidi[place]osis [place] significant exposure to the cocci I think it would be reasonable enough to do the CT scan as discussed and that once we do the CT scan will look at the imaging we will go from there Other dyspnea suggested also getting pulmonary function test she does not have any distinct history of smoking but secondhand smoke would be a consideration  General Counseling: I have discussed the findings of the evaluation and examination with Camil.  I have also discussed any further diagnostic evaluation thatmay be needed or ordered today. Batya verbalizes understanding of the findings of todays visit. We also reviewed her medications today and discussed drug interactions and side effects including but not limited excessive drowsiness and altered mental states. We also discussed that there is always a risk not just to her but also people around her. she has been encouraged to call the office with any questions or concerns that should arise related to todays visit.  Orders Placed This Encounter  Procedures   CT Chest Wo Contrast    Standing Status:   Future    Standing Expiration Date:   05/04/2022    Order Specific Question:   Preferred imaging location?    Answer:    [facility]   Pulmonary function test    Standing Status:   Future    Standing Expiration Date:   05/04/2022    Order Specific Question:   Where should this test be performed?    Answer:   [facility]     Time spent: 52  I have personally obtained a history, examined the patient, evaluated laboratory and imaging results, formulated the assessment and plan and placed orders.    Allyne Gee, MD [facility] Pulmonary and Critical Care Sleep medicine

## 2021-05-04 NOTE — Addendum Note (Signed)
Addended by: Loura Back B on: 05/04/2021 11:36 AM   Modules accepted: Orders

## 2021-05-05 ENCOUNTER — Ambulatory Visit: Payer: Medicare Other | Admitting: Internal Medicine

## 2021-05-07 LAB — CULTURE, URINE COMPREHENSIVE

## 2021-05-12 NOTE — Telephone Encounter (Signed)
Lvm for specialist office to return my call to verify if patient has been scheduled-Toni

## 2021-05-14 NOTE — Telephone Encounter (Signed)
Received call from [place] [facility] in [place]. She stated when she spoke with patient yesterday to schedule appointment, patient told her she wanted a rheumatologist in [place] area after all. I sent referral via [facility] to [facility]-Toni

## 2021-05-18 ENCOUNTER — Other Ambulatory Visit: Payer: Self-pay | Admitting: Family Medicine

## 2021-05-18 DIAGNOSIS — M5412 Radiculopathy, cervical region: Secondary | ICD-10-CM

## 2021-05-20 ENCOUNTER — Ambulatory Visit: Payer: Medicare Other | Admitting: Internal Medicine

## 2021-05-20 ENCOUNTER — Other Ambulatory Visit: Payer: Self-pay

## 2021-05-20 ENCOUNTER — Ambulatory Visit (INDEPENDENT_AMBULATORY_CARE_PROVIDER_SITE_OTHER): Payer: Medicare Other | Admitting: Internal Medicine

## 2021-05-20 DIAGNOSIS — R0602 Shortness of breath: Secondary | ICD-10-CM

## 2021-05-20 DIAGNOSIS — R0609 Other forms of dyspnea: Secondary | ICD-10-CM

## 2021-05-26 ENCOUNTER — Ambulatory Visit
Admission: RE | Admit: 2021-05-26 | Discharge: 2021-05-26 | Disposition: A | Discharge status: Home / Self Care | Payer: Medicare Other | Source: Ambulatory Visit | Attending: Internal Medicine | Admitting: Internal Medicine

## 2021-05-26 ENCOUNTER — Other Ambulatory Visit: Payer: Self-pay

## 2021-05-26 ENCOUNTER — Ambulatory Visit: Payer: Medicare Other

## 2021-05-26 DIAGNOSIS — R911 Solitary pulmonary nodule: Secondary | ICD-10-CM | POA: Insufficient documentation

## 2021-05-26 IMAGING — CT CT CHEST W/O CM
2 of 4 series · 15 of 36 positions shown, 18 images · non-contrast
Comparison: CTA head and neck, dated [DATE].

CLINICAL DATA: Follow-up lung nodule seen on prior CT a head and
neck.



[Series 2: chest 2.00 · axial · 0.58mm/px · z∈[-1183,-941]mm · 12 of 143 slices shown, 15 images]
[im 11/143  mediastinal]
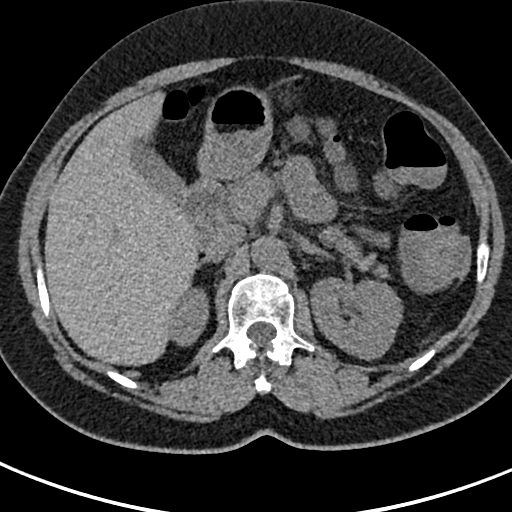
[im 11/143  lung]
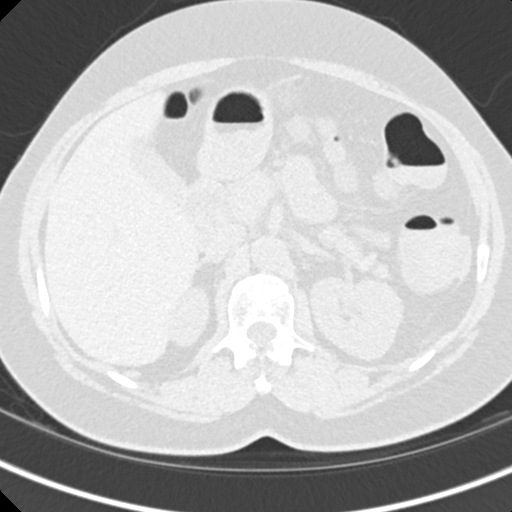
[im 22/143  lung]
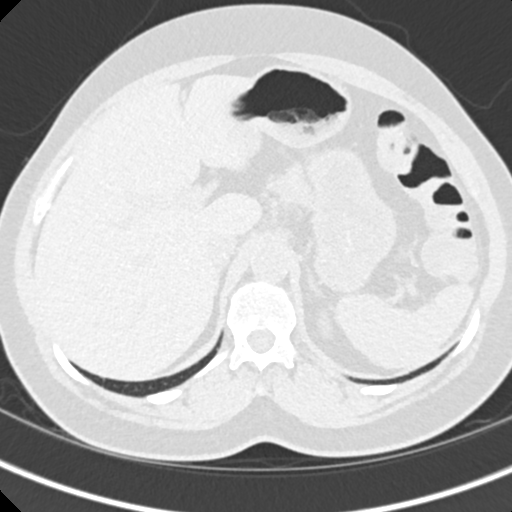
[im 33/143  lung]
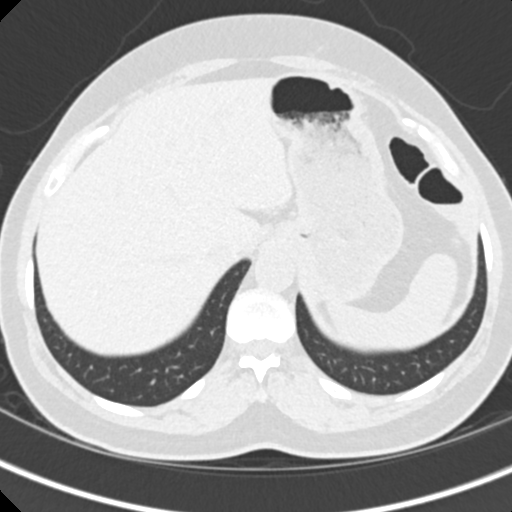
[im 44/143  lung]
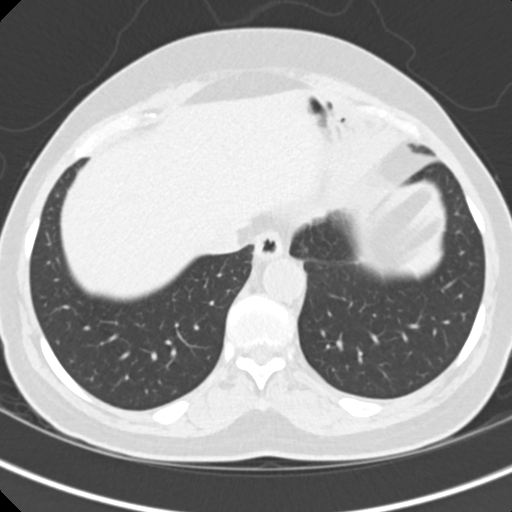
[im 55/143  mediastinal]
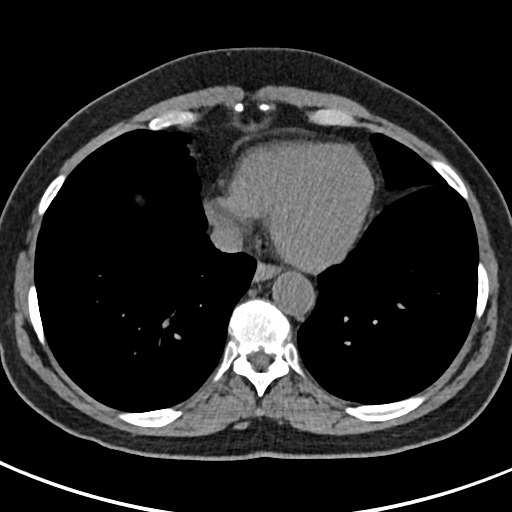
[im 55/143  lung]
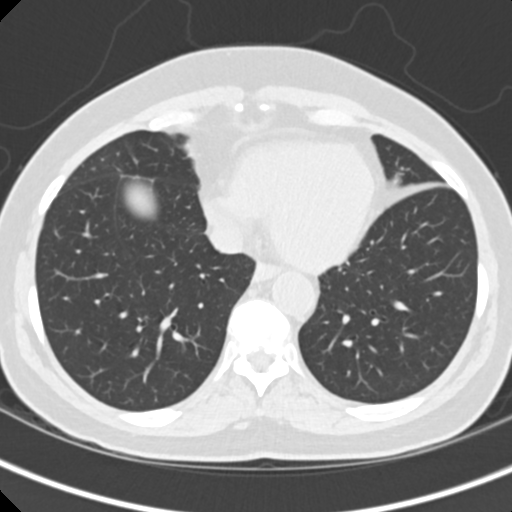
[im 66/143  lung]
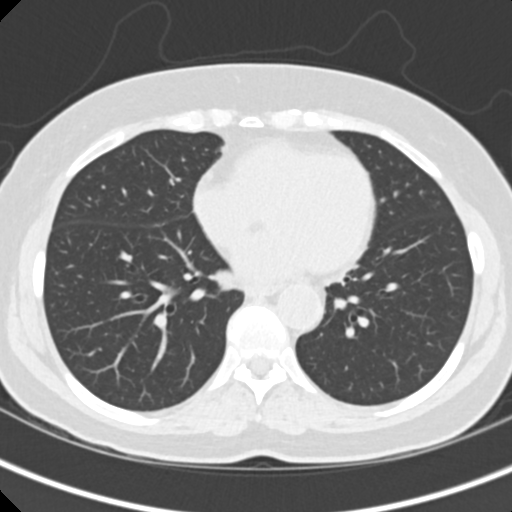
[im 77/143  lung]
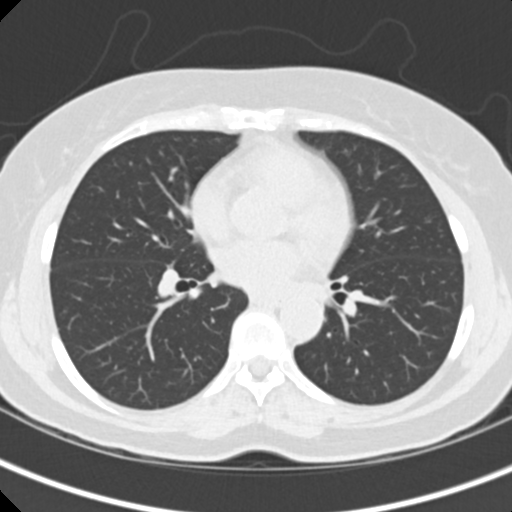
[im 88/143  lung]
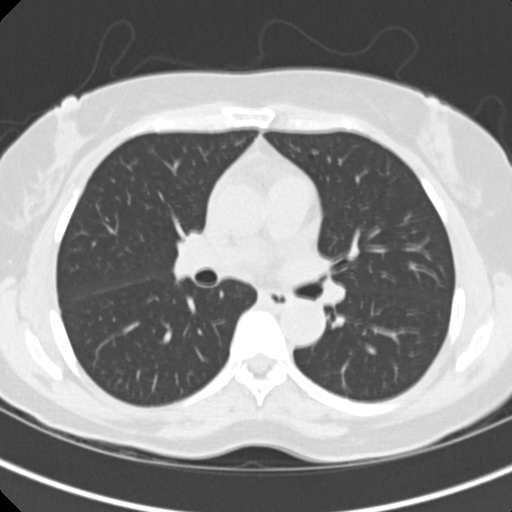
[im 99/143  mediastinal]
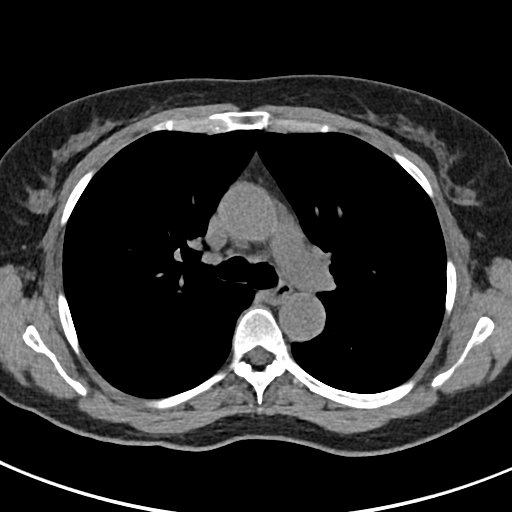
[im 99/143  lung]
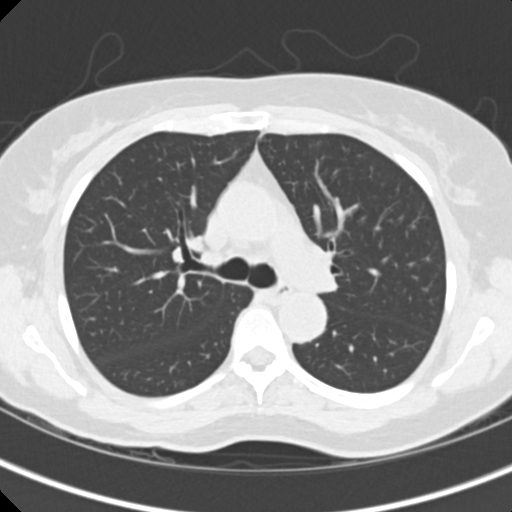
[im 110/143  lung]
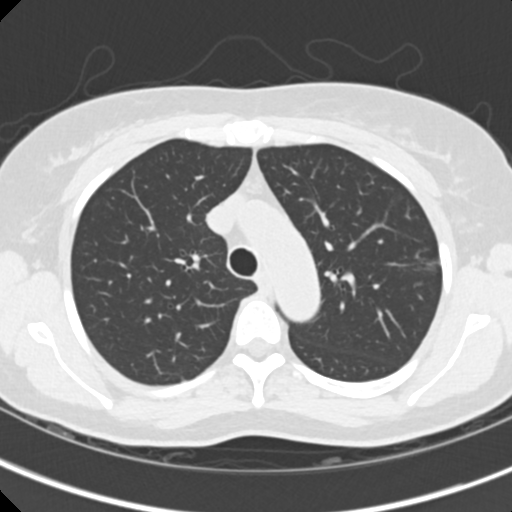
[im 121/143  lung]
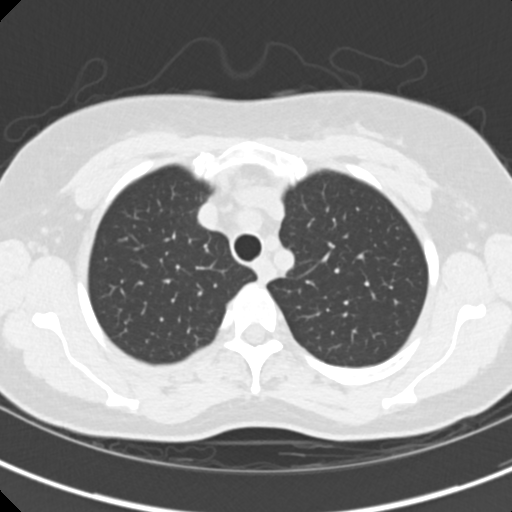
[im 132/143  lung]
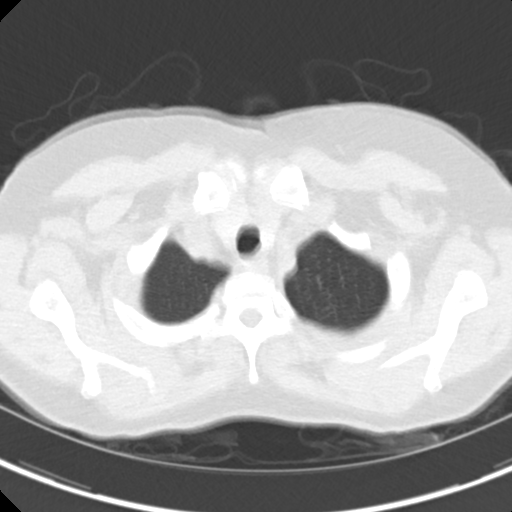

[Series 5: coronals chest 2.00 cor · coronal · 0.56mm/px · 3 of 149 slices shown]
[im 30/149  lung]
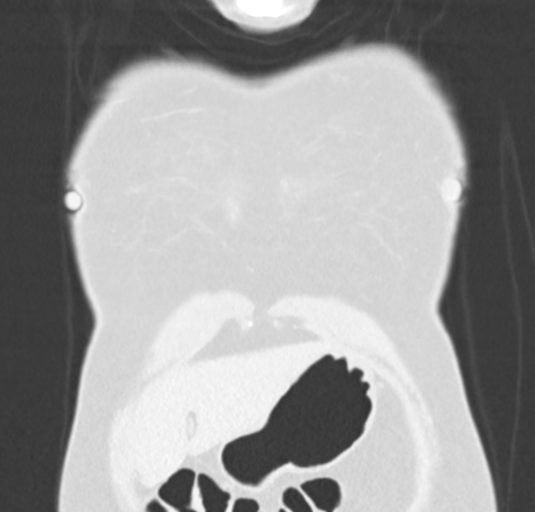
[im 60/149  lung]
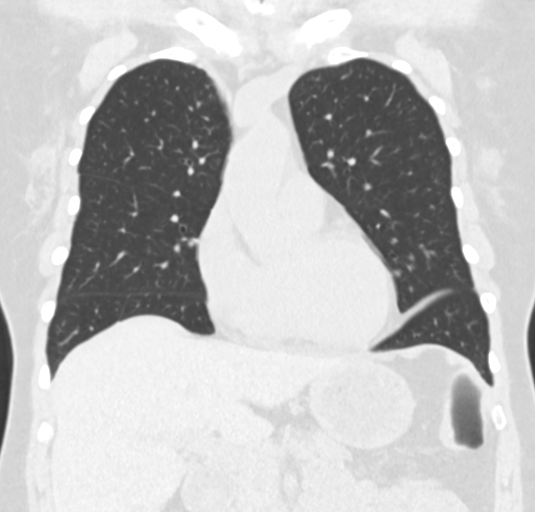
[im 89/149  lung]
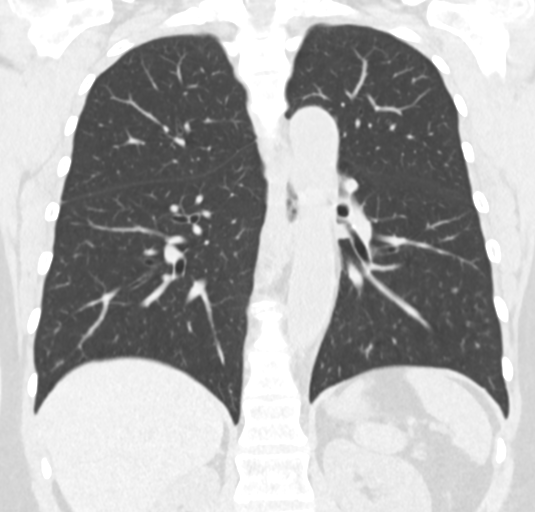

[15 of 36 positions shown; findings below may reference images not displayed]

FINDINGS: Cardiovascular: No significant vascular findings. Normal heart size.
No pericardial effusion.

Mediastinum/Nodes: No enlarged mediastinal or axillary lymph nodes.
Thyroid gland, trachea, and esophagus demonstrate no significant
findings.

Lungs/Pleura: A stable 9 mm, subsolid noncalcified lung nodule is
seen within the lateral aspect of the left upper lobe (axial CT
image 36, CT series 3). A very mild amount of adjacent, medial and
lateral scarring and/or atelectasis is seen. This is present on the
prior study and is decreased in severity on the current exam.

There is no evidence of acute infiltrate, pleural effusion or
pneumothorax.

Upper Abdomen: No acute abnormality.

Musculoskeletal: Chronic anterolateral fifth, sixth and seventh
right rib fractures are seen.
IMPRESSION: 1. Stable, 9 mm, subsolid left upper lobe noncalcified lung nodule
with very mild adjacent linear scarring and/or atelectasis. Consider
one of the following in 3 months for both low-risk and high-risk
individuals: (a) repeat chest CT, (b) follow-up PET-CT, or (c)
tissue sampling. This recommendation follows the consensus
statement: Guidelines for Management of Incidental Pulmonary Nodules
Detected on CT Images: From the [HOSPITAL] [QC]; Radiology
2. Chronic anterolateral fifth, sixth and seventh right rib
fractures.

## 2021-05-27 ENCOUNTER — Ambulatory Visit
Admission: RE | Admit: 2021-05-27 | Discharge: 2021-05-27 | Disposition: A | Discharge status: Home / Self Care | Payer: Medicare Other | Source: Ambulatory Visit | Attending: Family Medicine | Admitting: Family Medicine

## 2021-05-27 ENCOUNTER — Telehealth: Payer: Self-pay

## 2021-05-27 DIAGNOSIS — M5412 Radiculopathy, cervical region: Secondary | ICD-10-CM | POA: Diagnosis not present

## 2021-05-27 IMAGING — MR MR CERVICAL SPINE W/O CM
5 series · 38 of 48 positions shown · non-contrast
Comparison: No prior MRI, correlation is made with CTA head neck
[DATE]

CLINICAL DATA: Neck pain and limited range of motion, headaches

EXAM:
MRI CERVICAL SPINE WITHOUT CONTRAST
TECHNIQUE: Multiplanar, multisequence MR imaging of the cervical spine was
performed. No intravenous contrast was administered.

[Series 5: T2 · sagittal · 3.0mm · 0.62mm/px · 6 of 15 slices shown (1 of 2)]
[im 1/15]
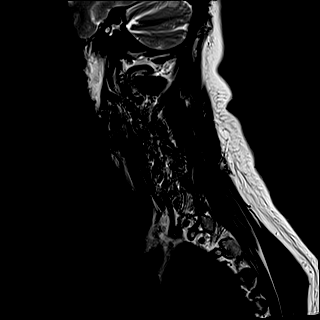
[im 3/15]
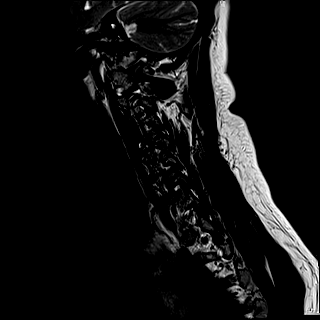
[im 6/15]
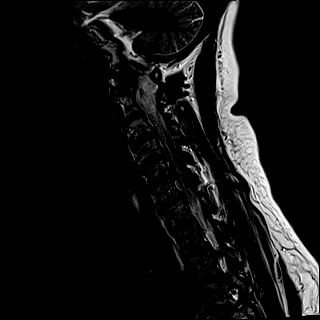
[im 9/15]
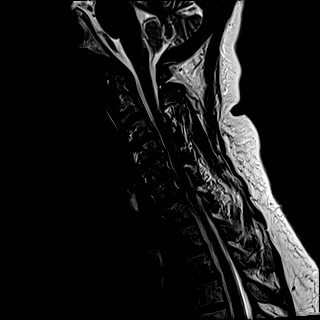
[im 12/15]
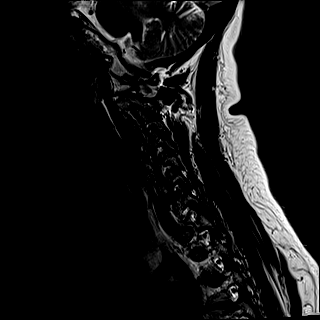
[im 15/15]
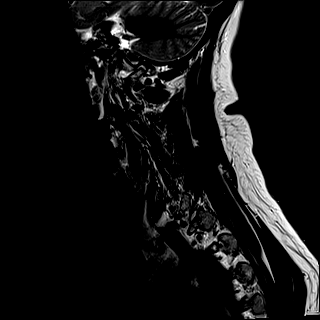

[Series 6: FLAIR · sagittal · 3.0mm · 0.78mm/px · 7 of 15 slices shown]
[im 1/15]
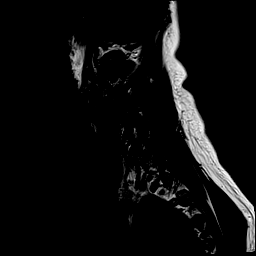
[im 3/15]
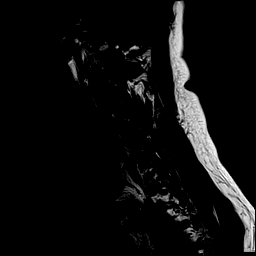
[im 5/15]
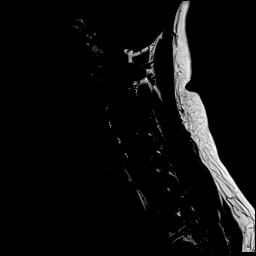
[im 8/15]
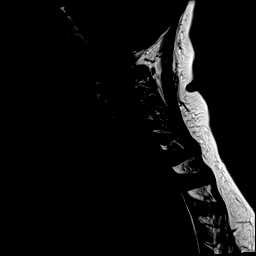
[im 10/15]
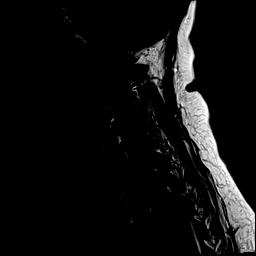
[im 12/15]
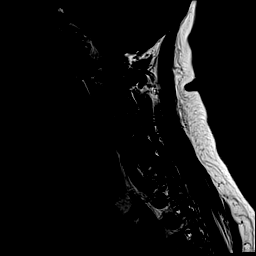
[im 15/15]
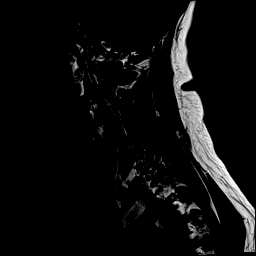

[Series 7: STIR · sagittal · 3.0mm · 0.62mm/px · 7 of 15 slices shown]
[im 1/15]
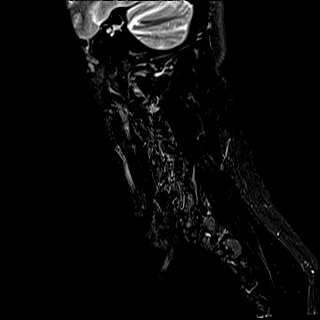
[im 3/15]
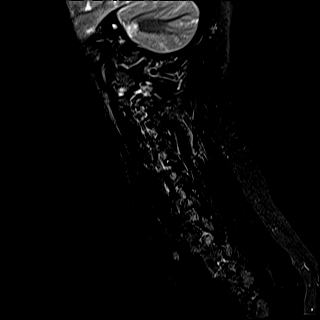
[im 5/15]
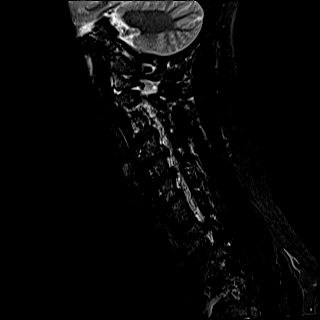
[im 8/15]
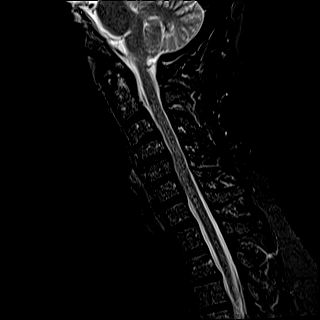
[im 10/15]
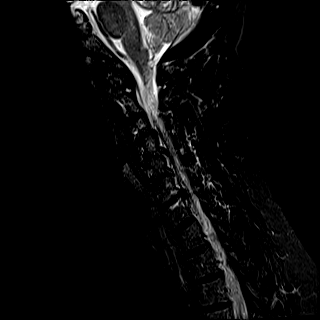
[im 12/15]
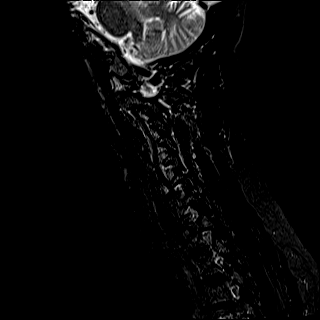
[im 15/15]
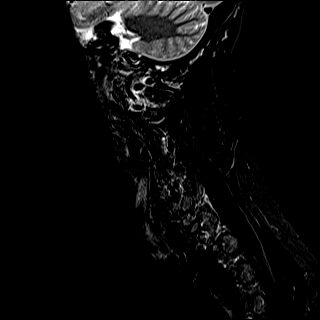

[Series 8: T2 · axial · 3.0mm · 0.70mm/px · z∈[-103,-11]mm · 10 of 29 slices shown (2 of 2)]
[im 1/29]
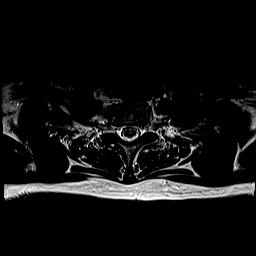
[im 3/29]
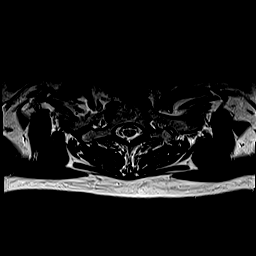
[im 5/29]
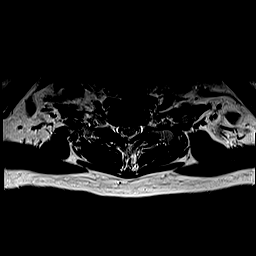
[im 7/29]
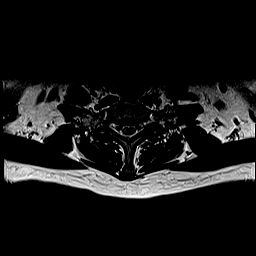
[im 9/29]
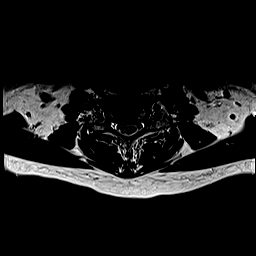
[im 13/29]
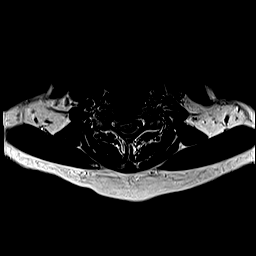
[im 16/29]
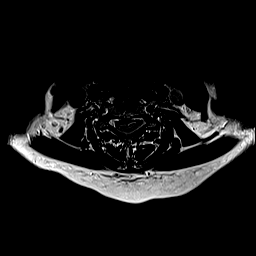
[im 20/29]
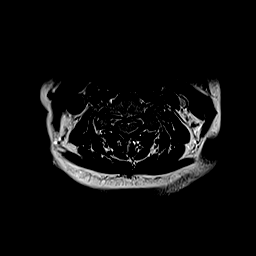
[im 24/29]
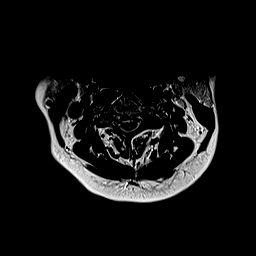
[im 29/29]
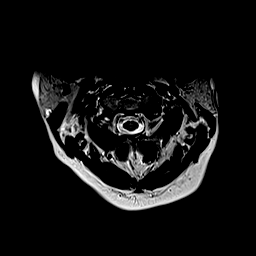

[Series 9: ax mpgr · axial · 3.0mm · 0.35mm/px · z∈[-103,-11]mm · 8 of 29 slices shown]
[im 1/29]
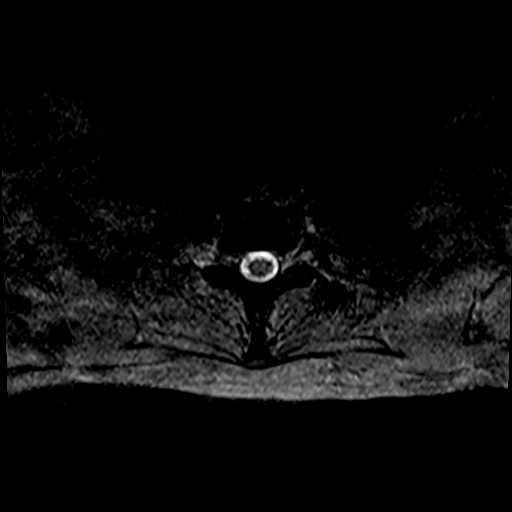
[im 5/29]
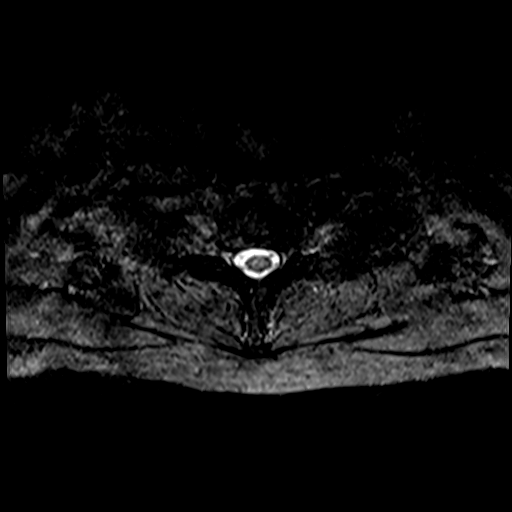
[im 9/29]
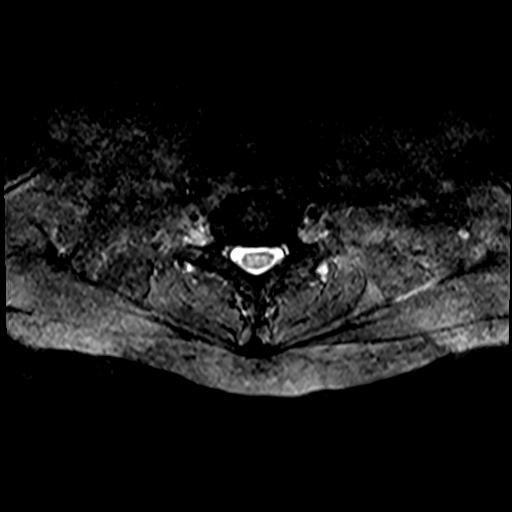
[im 13/29]
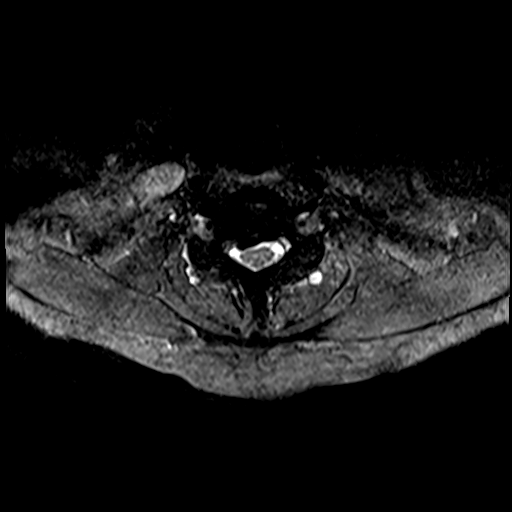
[im 16/29]
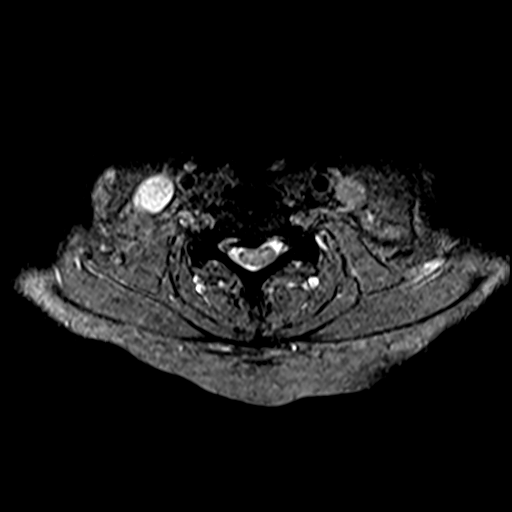
[im 20/29]
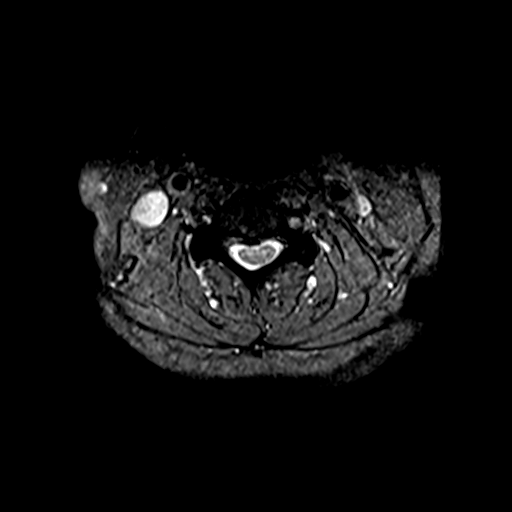
[im 24/29]
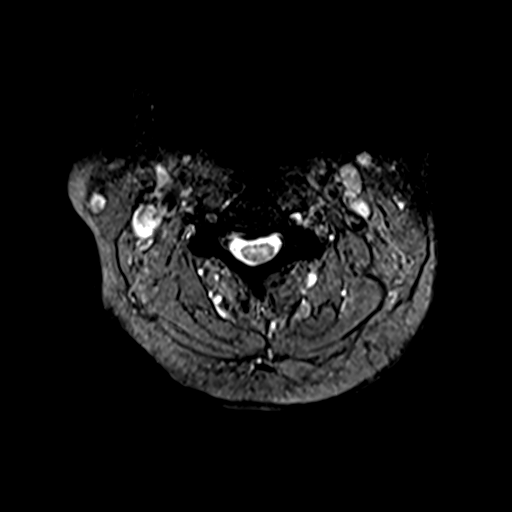
[im 29/29]
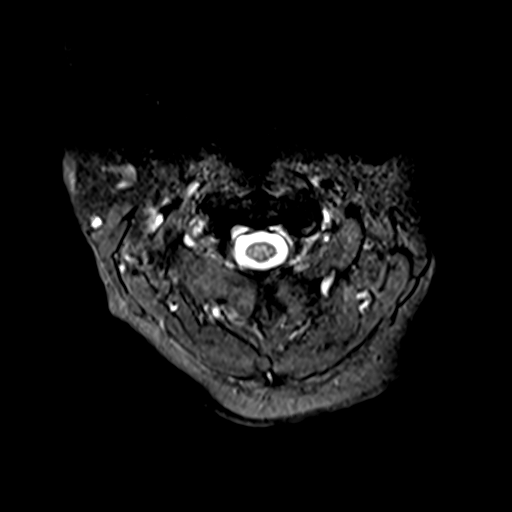

[38 of 48 positions shown; findings below may reference images not displayed]

FINDINGS: Alignment: Straightening and mild reversal of the normal cervical
lordosis. No significant listhesis.

Vertebrae: No acute fracture or suspicious osseous lesion.

Cord: Normal signal and morphology.

Posterior Fossa, vertebral arteries, paraspinal tissues: Negative.

Disc levels:

C2-C3: Mild disc bulge. Left-greater-than-right facet arthropathy.
No spinal canal stenosis or neural foraminal narrowing.

C3-C4: Mild disc bulge with central protrusion, which indents the
thecal sac. Mild spinal canal stenosis. Right-greater-than-left
uncovertebral and facet hypertrophy. Mild bilateral neural foraminal
narrowing.

C4-C5: Mild disc bulge with superimposed central protrusion, which
indents and mildly deforms the ventral cord. Moderate spinal canal
stenosis. Uncovertebral and facet arthropathy. Severe right and
moderate left neural foraminal narrowing.

C5-C6: Mild disc bulge. Mild spinal canal stenosis.
Right-greater-than-left uncovertebral and facet arthropathy. Severe
right and mild left neural foraminal narrowing.

C6-C7: Mild disc bulge. No spinal canal stenosis. Uncovertebral and
facet arthropathy. Moderate to severe left and moderate right neural
foraminal narrowing.

C7-T1: No significant disc bulge. No spinal canal stenosis or
neuroforaminal narrowing.
IMPRESSION: 1. C4-C5 moderate spinal canal stenosis with severe right and
moderate left neural foraminal narrowing.
2. C5-C6 mild spinal canal stenosis with severe right and mild left
neural foraminal narrowing.
3. C3-C4 mild spinal canal stenosis and mild bilateral neural
foraminal narrowing.
4. C6-C7 moderate to severe left and moderate right neural foraminal
narrowing.

## 2021-05-27 NOTE — Telephone Encounter (Signed)
Error

## 2021-05-27 NOTE — Telephone Encounter (Signed)
Appointment> 06/01/21 1:00-Toni

## 2021-06-02 ENCOUNTER — Encounter: Payer: Self-pay | Admitting: Internal Medicine

## 2021-06-02 ENCOUNTER — Telehealth: Payer: Self-pay

## 2021-06-02 ENCOUNTER — Other Ambulatory Visit: Payer: Self-pay

## 2021-06-02 ENCOUNTER — Ambulatory Visit: Payer: Medicare Other | Admitting: Internal Medicine

## 2021-06-02 VITALS — BP 100/72 | HR 68 | Temp 98.2°F | Resp 16 | Ht 61.25 in | Wt 137.2 lb

## 2021-06-02 DIAGNOSIS — M064 Inflammatory polyarthropathy: Secondary | ICD-10-CM | POA: Diagnosis not present

## 2021-06-02 DIAGNOSIS — R911 Solitary pulmonary nodule: Secondary | ICD-10-CM

## 2021-06-02 DIAGNOSIS — R768 Other specified abnormal immunological findings in serum: Secondary | ICD-10-CM | POA: Diagnosis not present

## 2021-06-02 DIAGNOSIS — R0609 Other forms of dyspnea: Secondary | ICD-10-CM

## 2021-06-02 NOTE — Progress Notes (Signed)
[facility] [place], [place] [address]  Pulmonary Sleep Medicine   Office Visit Note  Patient Name: Renee Patrick DOB: 1955/11/09 MRN [id number]  Date of Service: 06/02/2021  Complaints/HPI: Pulmonary nodule She had a CT scan shows stable 63m nodule with some scar tissue.  I [facility]  She does not have any symptoms at this time.  No think that based on the findings we should just monitor her with follow-up CT scans.  At this time denies any cough congestion.  Denies having any chest pain noted.  ROS  General: (-) fever, (-) chills, (-) night sweats, (-) weakness Skin: (-) rashes, (-) itching,. Eyes: (-) visual changes, (-) redness, (-) itching. Nose and Sinuses: (-) nasal stuffiness or itchiness, (-) postnasal drip, (-) nosebleeds, (-) sinus trouble. Mouth and Throat: (-) sore throat, (-) hoarseness. Neck: (-) swollen glands, (-) enlarged thyroid, (-) neck pain. Respiratory: - cough, (-) bloody sputum, - shortness of breath, - wheezing. Cardiovascular: - ankle swelling, (-) chest pain. Lymphatic: (-) lymph node enlargement. Neurologic: (-) numbness, (-) tingling. Psychiatric: (-) anxiety, (-) depression   Current Medication: Outpatient Encounter Medications as of 06/02/2021  Medication Sig   aspirin 81 MG EC tablet Take by mouth.   baclofen (LIORESAL) 10 MG tablet Take 10 mg by mouth 2 (two) times daily.   cyclobenzaprine (FLEXERIL) 5 MG tablet Take 1 tablet (5 mg total) by mouth 3 (three) times daily as needed for muscle spasms.   linaclotide (LINZESS) 290 MCG CAPS capsule Take 1 capsule (290 mcg total) by mouth daily.   ondansetron (ZOFRAN ODT) 4 MG disintegrating tablet Take 1 tablet (4 mg total) by mouth every 8 (eight) hours as needed for nausea or vomiting.   pantoprazole (PROTONIX) 40 MG tablet Take 40 mg by mouth daily.   sertraline (ZOLOFT) 100 MG tablet Take  100 mg by mouth daily.   No facility-administered encounter medications on file as of 06/02/2021.    Surgical History: Past Surgical History:  Procedure Laterality Date   [place]   Medical History: Past Medical History:  Diagnosis Date   Anxiety    Hyperlipidemia    TIA (transient ischemic attack)     Family History: Family History  Problem Relation Age of Onset   Ovarian cancer Mother    Lung cancer Father    Kidney disease Maternal Uncle    Hypertension Maternal Uncle     Social History: Social History   Socioeconomic History   Marital status: Married    Spouse name: Not on file   Number of children: Not on file   Years of education: Not on file   Highest education level: Not on file  Occupational History   Not on file  Tobacco Use   Smoking status: Never   Smokeless tobacco: Never  Vaping Use   Vaping Use: Never used  Substance and Sexual Activity   Alcohol use: Not Currently   Drug use: Never   Sexual activity: Not on file  Other Topics Concern   Not on file  Social History Narrative   Not on file   Social Determinants of Health   Financial Resource Strain: Not on file  Food Insecurity: Not on file  Transportation Needs: Not on file  Physical Activity: Not on file  Stress: Not on file  Social Connections: Not on file  Intimate Partner Violence: Not on file    Vital Signs: Blood pressure 100/72, pulse 68, temperature 98.2 F (36.8 C), resp. rate 16, height 5' 1.25" (1.556 m), weight 137 lb 3.2 oz (62.2 kg), SpO2 98 %.  Examination: General Appearance: The patient is well-developed, well-nourished, and in no distress. Skin: Gross inspection of skin unremarkable. Head: normocephalic, no gross deformities. Eyes: no gross deformities noted. ENT: ears appear grossly normal no exudates. Neck: Supple. No thyromegaly. No LAD. Respiratory: no rhochi noted. Cardiovascular: Normal S1 and S2 without murmur or  rub. Extremities: No cyanosis. pulses are equal. Neurologic: Alert and oriented. No involuntary movements.  LABS: Recent Results (from the past 2160 hour(s))  CBC w/Diff/Platelet     Status: None   Collection Time: 03/05/21  1:20 PM  Result Value Ref Range   WBC 7.7 3.4 - 10.8 x10E3/uL   RBC 4.58 3.77 - 5.28 x10E6/uL   Hemoglobin 12.8 11.1 - 15.9 g/dL   Hematocrit 38.6 34.0 - 46.6 %   MCV 84 79 - 97 fL   MCH 27.9 26.6 - 33.0 pg   MCHC 33.2 31.5 - 35.7 g/dL   RDW 12.3 11.7 - 15.4 %   Platelets 257 150 - 450 x10E3/uL   Neutrophils 60 Not Estab. %   Lymphs 29 Not Estab. %   Monocytes 9 Not Estab. %   Eos 1 Not Estab. %   Basos 1 Not Estab. %   Neutrophils Absolute 4.6 1.4 - 7.0 x10E3/uL   Lymphocytes Absolute 2.2 0.7 - 3.1 x10E3/uL   Monocytes Absolute 0.7 0.1 - 0.9 x10E3/uL   EOS (ABSOLUTE) 0.1 0.0 - 0.4 x10E3/uL   Basophils Absolute 0.1 0.0 - 0.2 x10E3/uL   Immature Granulocytes 0 Not Estab. %   Immature Grans (Abs) 0.0 0.0 - 0.1 x10E3/uL  Comprehensive metabolic panel     Status: Abnormal   Collection Time: 03/05/21  1:20 PM  Result Value Ref Range   Glucose 94 70 - 99 mg/dL   BUN 11 8 - 27 mg/dL   Creatinine, Ser 0.88 0.57 - 1.00 mg/dL   eGFR 73 >59 mL/min/1.73   BUN/Creatinine Ratio 13 12 - 28   Sodium 145 (H) 134 - 144 mmol/L   Potassium 4.2 3.5 - 5.2 mmol/L   Chloride 104 96 - 106 mmol/L   CO2 25 20 - 29 mmol/L   Calcium 9.7 8.7 - 10.3 mg/dL   Total Protein 7.0 6.0 - 8.5 g/dL   Albumin 4.7 3.8 - 4.8 g/dL   Globulin, Total 2.3 1.5 - 4.5 g/dL   Albumin/Globulin Ratio 2.0 1.2 - 2.2   Bilirubin Total 0.5 0.0 - 1.2 mg/dL   Alkaline Phosphatase 78 44 - 121 IU/L   AST 24 0 - 40 IU/L   ALT 11 0 - 32 IU/L  Lipid Panel With LDL/HDL Ratio     Status: Abnormal   Collection Time: 03/05/21  1:20 PM  Result Value Ref Range   Cholesterol, Total 267 (H) 100 - 199 mg/dL   Triglycerides 191 (H) 0 - 149 mg/dL   HDL 47 >39 mg/dL   VLDL Cholesterol Cal 36 5 - 40 mg/dL   LDL  Chol Calc (NIH) 184 (H) 0 - 99 mg/dL   LDL/HDL Ratio 3.9 (H) 0.0 - 3.2 ratio    Comment:  LDL/HDL Ratio                                             Men  Women                               1/2 Avg.Risk  1.0    1.5                                   Avg.Risk  3.6    3.2                                2X Avg.Risk  6.2    5.0                                3X Avg.Risk  8.0    6.1   TSH + free T4     Status: None   Collection Time: 03/05/21  1:20 PM  Result Value Ref Range   TSH 0.763 0.450 - 4.500 uIU/mL   Free T4 1.22 0.82 - 1.77 ng/dL  B12 and Folate Panel     Status: Abnormal   Collection Time: 03/05/21  1:20 PM  Result Value Ref Range   Vitamin B-12 1,676 (H) 232 - 1,245 pg/mL   Folate >20.0 >3.0 ng/mL    Comment: A serum folate concentration of less than 3.1 ng/mL is considered to represent clinical deficiency.   VITAMIN D 25 Hydroxy (Vit-D Deficiency, Fractures)     Status: Abnormal   Collection Time: 03/05/21  1:20 PM  Result Value Ref Range   Vit D, 25-Hydroxy 27.5 (L) 30.0 - 100.0 ng/mL    Comment: Vitamin D deficiency has been defined by the [place] practice guideline as a level of serum 25-OH vitamin D less than 20 ng/mL (1,2). The Endocrine Society went on to further define vitamin D insufficiency as a level between 21 and 29 ng/mL (2). 1. IOM (Institute of Medicine). 2010. Dietary reference    intakes for calcium and D. Opp: The    [employer]. 2. Holick MF, Binkley [place], Bischoff-Ferrari HA, et al.    Evaluation, treatment, and prevention of vitamin D    deficiency: an Endocrine Society clinical practice    guideline. JCEM. 2011 Jul; 96(7):1911-30.   Rheumatoid Factor     Status: None   Collection Time: 03/05/21  1:20 PM  Result Value Ref Range   Rhuematoid fact SerPl-aCnc <10.0 <14.0 IU/mL  ANA w/Reflex if Positive     Status: Abnormal   Collection Time: 03/05/21  1:20  PM  Result Value Ref Range   Anti Nuclear Antibody (ANA) Positive (A) Negative   dsDNA Ab 1 0 - 9 IU/mL    Comment:                                    Negative      <5  Equivocal  5 - 9                                    Positive      >9    ENA RNP Ab 0.2 0.0 - 0.9 AI   ENA SM Ab Ser-aCnc <0.2 0.0 - 0.9 AI   Scleroderma (Scl-70) (ENA) Antibody, IgG <0.2 0.0 - 0.9 AI   ENA SSA (RO) Ab 1.1 (H) 0.0 - 0.9 AI   ENA SSB (LA) Ab <0.2 0.0 - 0.9 AI   Chromatin Ab SerPl-aCnc <0.2 0.0 - 0.9 AI   Anti JO-1 <0.2 0.0 - 0.9 AI   Centromere Ab Screen <0.2 0.0 - 0.9 AI   See below: Comment     Comment: Autoantibody                       Disease Association ------------------------------------------------------------                         Condition                  Frequency ---------------------   ------------------------   --------- Antinuclear Antibody,    SLE, mixed connective Direct (ANA-D)           tissue diseases ---------------------   ------------------------   --------- dsDNA                    SLE                        40 - 60% ---------------------   ------------------------   --------- Chromatin                Drug induced SLE                90%                          SLE                        48 - 97% ---------------------   ------------------------   --------- SSA (Ro)                 SLE                        25 - 35%                          Sjogren's Syndrome         40 - 70%                          Neonatal Lupus                 100% ---------------------   ------------------------   --------- SSB (La)                 SLE                              10%  Sjogren's Syndrome              30% ---------------------   -----------------------    --------- Sm (anti-Smith)          SLE                        15 - 30% ---------------------   -----------------------    --------- RNP                      Mixed Connective  Tissue                          Disease                         95% (U1 nRNP,                SLE                        30 - 50% anti-ribonucleoprotein)  Polymyositis and/or                          Dermatomyositis                 20% ---------------------   ------------------------   --------- Scl-70 (antiDNA          Scleroderma (diffuse)      20 - 35% topoisomerase)           Crest                           13% ---------------------   ------------------------   --------- Jo-1                     Polymyositis and/or                          Dermatomyositis            20 - 40% ---------------------   ------------------------   --------- Centromere B             Scleroderma -  Crest                          variant                         80%   IGP, Aptima HPV     Status: None   Collection Time: 03/05/21  2:39 PM  Result Value Ref Range   Interpretation NILM     Comment: NEGATIVE FOR INTRAEPITHELIAL LESION OR MALIGNANCY.   Category NIL     Comment: Negative for Intraepithelial Lesion   Adequacy SECNI     Comment: Satisfactory for evaluation. No endocervical component is identified.   Clinician Provided ICD10 Comment     Comment: Z12.4   Performed by: Comment     Comment: Krystal Eaton, Cytotechnologist (ASCP)   Note: Comment     Comment: The Pap smear is a screening test designed to aid in the detection of premalignant and malignant conditions of the uterine cervix.  It is not a diagnostic procedure and should not be used as the sole means of detecting cervical cancer.  Both false-positive  and false-negative reports do occur.    Test Methodology Comment     Comment: This liquid based ThinPrep(R) pap test was screened with the use of an image guided system.    HPV Aptima Negative Negative    Comment: This nucleic acid amplification test detects fourteen high-risk HPV types (16,18,31,33,35,39,45,51,52,56,58,59,66,68) without differentiation.   UA/M w/rflx Culture, Routine      Status: Abnormal   Collection Time: 03/05/21  2:39 PM   Specimen: Urine   Urine  Result Value Ref Range   Specific Gravity, UA 1.014 1.005 - 1.030   pH, UA 6.0 5.0 - 7.5   Color, UA Yellow Yellow   Appearance Ur Cloudy (A) Clear   Leukocytes,UA Negative Negative   Protein,UA Negative Negative/Trace   Glucose, UA Negative Negative   Ketones, UA Negative Negative   RBC, UA Negative Negative   Bilirubin, UA Negative Negative   Urobilinogen, Ur 0.2 0.2 - 1.0 mg/dL   Nitrite, UA Negative Negative   Microscopic Examination Comment     Comment: Microscopic follows if indicated.   Microscopic Examination See below:     Comment: Microscopic was indicated and was performed.   Urinalysis Reflex Comment     Comment: This specimen has reflexed to a Urine Culture.  NuSwab Vaginitis Plus (VG+)     Status: Abnormal   Collection Time: 03/05/21  2:39 PM  Result Value Ref Range   Atopobium vaginae Low - 0 Score   BVAB 2 Low - 0 Score   Megasphaera 1 Low - 0 Score    Comment: Calculate total score by adding the 3 individual bacterial vaginosis (BV) marker scores together.  Total score is interpreted as follows: Total score 0-1: Indicates the absence of BV. Total score   2: Indeterminate for BV. Additional clinical                  data should be evaluated to establish a                  diagnosis. Total score 3-6: Indicates the presence of BV. This test was developed and its performance characteristics determined by [facility].  It has not been cleared or approved by the Food and Drug Administration.    Candida albicans, NAA Positive (A) Negative   Candida glabrata, NAA Negative Negative   Trich vag by NAA Negative Negative   Chlamydia trachomatis, NAA Negative Negative   Neisseria gonorrhoeae, NAA Negative Negative  Microscopic Examination     Status: Abnormal   Collection Time: 03/05/21  2:39 PM   Urine  Result Value Ref Range   WBC, UA 6-10 (A) 0 - 5 /hpf   RBC None seen 0 - 2 /hpf    Epithelial Cells (non renal) None seen 0 - 10 /hpf   Casts None seen None seen /lpf   Bacteria, UA Many (A) None seen/Few  Urine Culture, Reflex     Status: Abnormal   Collection Time: 03/05/21  2:39 PM   Urine  Result Value Ref Range   Urine Culture, Routine Final report (A)    Organism ID, Bacteria Klebsiella pneumoniae (A)     Comment: Cefazolin <=4 ug/mL Cefazolin with an MIC <=16 predicts susceptibility to the oral agents cefaclor, cefdinir, cefpodoxime, cefprozil, cefuroxime, cephalexin, and loracarbef when used for therapy of uncomplicated urinary tract infections due to E. coli, Klebsiella pneumoniae, and Proteus mirabilis. Greater than 100,000 colony forming units per mL    Antimicrobial Susceptibility Comment  Comment:       ** S = Susceptible; I = Intermediate; R = Resistant **                    P = Positive; N = Negative             MICS are expressed in micrograms per mL    Antibiotic                 RSLT#1    RSLT#2    RSLT#3    RSLT#4 Amoxicillin/Clavulanic Acid    S Ampicillin                     R Cefepime                       S Ceftriaxone                    S Cefuroxime                     S Ciprofloxacin                  S Ertapenem                      S Gentamicin                     S Imipenem                       S Levofloxacin                   S Meropenem                      S Nitrofurantoin                 S Piperacillin/Tazobactam        S Tetracycline                   S Tobramycin                     S Trimethoprim/Sulfa             S   Comprehensive metabolic panel     Status: Abnormal   Collection Time: 03/16/21  5:56 AM  Result Value Ref Range   Sodium 139 135 - 145 mmol/L   Potassium 3.4 (L) 3.5 - 5.1 mmol/L   Chloride 105 98 - 111 mmol/L   CO2 25 22 - 32 mmol/L   Glucose, Bld 123 (H) 70 - 99 mg/dL    Comment: Glucose reference range applies only to samples taken after fasting for at least 8 hours.   BUN 11 8 - 23 mg/dL    Creatinine, Ser 0.80 0.44 - 1.00 mg/dL   Calcium 9.3 8.9 - 10.3 mg/dL   Total Protein 7.2 6.5 - 8.1 g/dL   Albumin 4.0 3.5 - 5.0 g/dL   AST 26 15 - 41 U/L   ALT 15 0 - 44 U/L   Alkaline Phosphatase 68 38 - 126 U/L   Total Bilirubin 0.8 0.3 - 1.2 mg/dL   GFR, Estimated >60 >60 mL/min    Comment: (NOTE) Calculated using the CKD-EPI Creatinine Equation (2021)    Anion gap 9 5 - 15    Comment: Performed  at [facility], [address] [place], [place] [address]  CBC with Differential     Status: Abnormal   Collection Time: 03/16/21  5:56 AM  Result Value Ref Range   WBC 9.9 4.0 - 10.5 K/uL   RBC 4.28 3.87 - 5.11 MIL/uL   Hemoglobin 11.8 (L) 12.0 - 15.0 g/dL   HCT 37.0 36.0 - 46.0 %   MCV 86.4 80.0 - 100.0 fL   MCH 27.6 26.0 - 34.0 pg   MCHC 31.9 30.0 - 36.0 g/dL   RDW 12.4 11.5 - 15.5 %   Platelets 228 150 - 400 K/uL   nRBC 0.0 0.0 - 0.2 %   Neutrophils Relative % 67 %   Neutro Abs 6.5 1.7 - 7.7 K/uL   Lymphocytes Relative 23 %   Lymphs Abs 2.3 0.7 - 4.0 K/uL   Monocytes Relative 9 %   Monocytes Absolute 0.9 0.1 - 1.0 K/uL   Eosinophils Relative 1 %   Eosinophils Absolute 0.1 0.0 - 0.5 K/uL   Basophils Relative 0 %   Basophils Absolute 0.0 0.0 - 0.1 K/uL   Immature Granulocytes 0 %   Abs Immature Granulocytes 0.03 0.00 - 0.07 K/uL    Comment: Performed at [facility], [address] [place], [place] [address]  POCT Urinalysis Dipstick     Status: Abnormal   Collection Time: 05/04/21 11:34 AM  Result Value Ref Range   Color, UA     Clarity, UA     Glucose, UA Negative Negative   Bilirubin, UA neg    Ketones, UA neg    Spec Grav, UA >=1.030 (A) 1.010 - 1.025   Blood, UA trace    pH, UA 5.0 5.0 - 8.0   Protein, UA Negative Negative   Urobilinogen, UA negative (A) 0.2 or 1.0 E.U./dL   Nitrite, UA neg    Leukocytes, UA Negative Negative   Appearance     Odor    CULTURE, URINE COMPREHENSIVE     Status: None   Collection Time: 05/04/21  3:00 PM    Specimen: Urine   Urine  Result Value Ref Range   Urine Culture, Comprehensive Final report    Organism ID, Bacteria Comment     Comment: Mixed urogenital flora 4,000 Colonies/mL     Radiology: MR CERVICAL SPINE WO CONTRAST  Result Date: 05/29/2021 CLINICAL DATA:  Neck pain and limited range of motion, headaches EXAM: MRI CERVICAL SPINE WITHOUT CONTRAST TECHNIQUE: Multiplanar, multisequence MR imaging of the cervical spine was performed. No intravenous contrast was administered. COMPARISON:  No prior MRI, correlation is made with CTA head neck 03/16/2021 FINDINGS: Alignment: Straightening and mild reversal of the normal cervical lordosis. No significant listhesis. Vertebrae: No acute fracture or suspicious osseous lesion. Cord: Normal signal and morphology. Posterior Fossa, vertebral arteries, paraspinal tissues: Negative. Disc levels: C2-C3: Mild disc bulge. Left-greater-than-right facet arthropathy. No spinal canal stenosis or neural foraminal narrowing. C3-C4: Mild disc bulge with central protrusion, which indents the thecal sac. Mild spinal canal stenosis. Right-greater-than-left uncovertebral and facet hypertrophy. Mild bilateral neural foraminal narrowing. C4-C5: Mild disc bulge with superimposed central protrusion, which indents and mildly deforms the ventral cord. Moderate spinal canal stenosis. Uncovertebral and facet arthropathy. Severe right and moderate left neural foraminal narrowing. C5-C6: Mild disc bulge. Mild spinal canal stenosis. Right-greater-than-left uncovertebral and facet arthropathy. Severe right and mild left neural foraminal narrowing. C6-C7: Mild disc bulge. No spinal canal stenosis. Uncovertebral and facet arthropathy. Moderate to severe left and moderate  right neural foraminal narrowing. C7-T1: No significant disc bulge. No spinal canal stenosis or neuroforaminal narrowing. IMPRESSION: 1. C4-C5 moderate spinal canal stenosis with severe right and moderate left neural  foraminal narrowing. 2. C5-C6 mild spinal canal stenosis with severe right and mild left neural foraminal narrowing. 3. C3-C4 mild spinal canal stenosis and mild bilateral neural foraminal narrowing. 4. C6-C7 moderate to severe left and moderate right neural foraminal narrowing. Electronically Signed   By: Merilyn Baba M.D.   On: 05/29/2021 02:42    No results found.  CT Chest Wo Contrast  Result Date: 05/27/2021 CLINICAL DATA:  Follow-up lung nodule seen on prior CT a head and neck. EXAM: CT CHEST WITHOUT CONTRAST TECHNIQUE: Multidetector CT imaging of the chest was performed following the standard protocol without IV contrast. RADIATION DOSE REDUCTION: This exam was performed according to the departmental dose-optimization program which includes automated exposure control, adjustment of the mA and/or kV according to patient size and/or use of iterative reconstruction technique. COMPARISON:  CTA head and neck, dated March 16, 2021. FINDINGS: Cardiovascular: No significant vascular findings. Normal heart size. No pericardial effusion. Mediastinum/Nodes: No enlarged mediastinal or axillary lymph nodes. Thyroid gland, trachea, and esophagus demonstrate no significant findings. Lungs/Pleura: A stable 9 mm, subsolid noncalcified lung nodule is seen within the lateral aspect of the left upper lobe (axial CT image 36, CT series 3). A very mild amount of adjacent, medial and lateral scarring and/or atelectasis is seen. This is present on the prior study and is decreased in severity on the current exam. There is no evidence of acute infiltrate, pleural effusion or pneumothorax. Upper Abdomen: No acute abnormality. Musculoskeletal: Chronic anterolateral fifth, sixth and seventh right rib fractures are seen. IMPRESSION: 1. Stable, 9 mm, subsolid left upper lobe noncalcified lung nodule with very mild adjacent linear scarring and/or atelectasis. Consider one of the following in 3 months for both low-risk and  high-risk individuals: (a) repeat chest CT, (b) follow-up PET-CT, or (c) tissue sampling. This recommendation follows the consensus statement: Guidelines for Management of Incidental Pulmonary Nodules Detected on CT Images: From the Fleischner Society 2017; Radiology 2017; 284:228-243. 2. Chronic anterolateral fifth, sixth and seventh right rib fractures. Electronically Signed   By: Virgina Norfolk M.D.   On: 05/27/2021 16:57   MR CERVICAL SPINE WO CONTRAST  Result Date: 05/29/2021 CLINICAL DATA:  Neck pain and limited range of motion, headaches EXAM: MRI CERVICAL SPINE WITHOUT CONTRAST TECHNIQUE: Multiplanar, multisequence MR imaging of the cervical spine was performed. No intravenous contrast was administered. COMPARISON:  No prior MRI, correlation is made with CTA head neck 03/16/2021 FINDINGS: Alignment: Straightening and mild reversal of the normal cervical lordosis. No significant listhesis. Vertebrae: No acute fracture or suspicious osseous lesion. Cord: Normal signal and morphology. Posterior Fossa, vertebral arteries, paraspinal tissues: Negative. Disc levels: C2-C3: Mild disc bulge. Left-greater-than-right facet arthropathy. No spinal canal stenosis or neural foraminal narrowing. C3-C4: Mild disc bulge with central protrusion, which indents the thecal sac. Mild spinal canal stenosis. Right-greater-than-left uncovertebral and facet hypertrophy. Mild bilateral neural foraminal narrowing. C4-C5: Mild disc bulge with superimposed central protrusion, which indents and mildly deforms the ventral cord. Moderate spinal canal stenosis. Uncovertebral and facet arthropathy. Severe right and moderate left neural foraminal narrowing. C5-C6: Mild disc bulge. Mild spinal canal stenosis. Right-greater-than-left uncovertebral and facet arthropathy. Severe right and mild left neural foraminal narrowing. C6-C7: Mild disc bulge. No spinal canal stenosis. Uncovertebral and facet arthropathy. Moderate to severe left and  moderate right neural foraminal narrowing. C7-T1:  No significant disc bulge. No spinal canal stenosis or neuroforaminal narrowing. IMPRESSION: 1. C4-C5 moderate spinal canal stenosis with severe right and moderate left neural foraminal narrowing. 2. C5-C6 mild spinal canal stenosis with severe right and mild left neural foraminal narrowing. 3. C3-C4 mild spinal canal stenosis and mild bilateral neural foraminal narrowing. 4. C6-C7 moderate to severe left and moderate right neural foraminal narrowing. Electronically Signed   By: Merilyn Baba M.D.   On: 05/29/2021 02:42      Assessment and Plan: Patient Active Problem List   Diagnosis Date Noted   HTN (hypertension) 08/05/2020   Precordial pain 08/05/2020   Hyperlipidemia 08/01/2020   Irritable bowel syndrome 08/01/2020   Mixed anxiety depressive disorder 08/01/2020   Primary osteoarthritis of both knees 08/01/2020   Spondylosis of lumbar spine 08/01/2020    1. 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  We will monitor to show stability - CT CHEST NODULE FOLLOW UP LOW DOSE W/O; Future  2. Other form of dyspnea Pulmonary functions would be suggested.  3. Positive ANA (antinuclear antibody) She will follow-up with her rheumatologist.  4. Inflammatory polyarthritis ([place]) As above needs follow-up with rheumatology.  General Counseling: I have discussed the findings of the evaluation and examination with Renee Patrick.  I have also discussed any further diagnostic evaluation thatmay be needed or ordered today. Renee Patrick verbalizes understanding of the findings of todays visit. We also reviewed her medications today and discussed drug interactions and side effects including but not limited excessive drowsiness and altered mental states. We also discussed that there is always a risk not just to her but also people around her. she has been encouraged to call the office with any questions or concerns that should arise  related to todays visit.  Orders Placed This Encounter  Procedures   CT CHEST NODULE FOLLOW UP LOW DOSE W/O    Standing Status:   Future    Standing Expiration Date:   06/02/2022    Order Specific Question:   Preferred imaging location?    Answer:   [facility]     Time spent: 25  I have personally obtained a history, examined the patient, evaluated laboratory and imaging results, formulated the assessment and plan and placed orders.    Allyne Gee, MD [facility] Pulmonary and Critical Care Sleep medicine

## 2021-06-02 NOTE — Patient Instructions (Signed)
Pulmonary Nodule ?A pulmonary nodule is a small, round growth of tissue in the lung. A nodule may be cancer, but most nodules are not cancer. ?What are the causes? ?Infection from a germ (bacteria, fungus, or virus), such as tuberculosis. ?Tissue that is cancer, such as: ?Cancer in the lung. ?Cancer that has spread to the lung from another part of the body. ?A growth of tissue (mass) that is not cancer. ?Swelling and irritation from conditions such as rheumatoid arthritis. ?Having blood vessels that are not normal in the lungs. ?What are the signs or symptoms? ?Many times, there are no symptoms. If you get symptoms, they normally have another cause, such as infection. ?How is this treated? ?Treatment depends on: ?If your nodule is cancer or if it is not cancer. ?What your risk of getting cancer is. ?Some nodules are not cancer. If this is the case for you, you may not need treatment. Your doctor may do tests to watch the nodule for changes. ?If the nodule is cancer: ?You will need tests, such as CT and PET scans. ?You may need treatment. This may include: ?Surgery. ?Treatment with high-energy X-rays (radiation therapy). ?Medicines. ?Some nodules need to be taken out. You may have a procedure to have the nodule taken out. During the procedure, your doctor will make a cut (incision) into your chest and take out the part of your lung that has the nodule. ?Follow these instructions at home: ?Take over-the-counter and prescription medicines only as told by your doctor. ?Do not smoke or use any products that contain nicotine or tobacco. If you need help quitting, ask your doctor. ?Keep all follow-up visits. ?Contact a doctor if: ?You have pain in your chest, back, or shoulder. ?You are short of breath or have trouble breathing when you are active. ?You get a cough. ?Your voice starts to sound raspy, breathy, or strained (hoarse), and you do not know why. ?You feel sick or more tired than normal. ?You do not feel like  eating. ?You lose weight without trying. ?You get chills, or you start to sweat a lot during sleep. ?You need two or more pillows to sleep on at night. ?You have: ?A fever and your symptoms get worse all of a sudden. ?A fever or symptoms for more than 2-3 days. ?Get help right away if: ?You cannot catch your breath. ?You have sudden chest pain. ?You start making high-pitched whistling sounds when you breathe, most often when you breathe out (you wheeze). ?You cannot stop coughing. ?You cough up blood or bloody mucus from your lungs (sputum). ?You get dizzy or feel like you may faint. ?These symptoms may represent a serious problem that is an emergency. Do not wait to see if the symptoms will go away. Get medical help right away. Call your local emergency services (911 in the [place] Do not drive yourself to the hospital. ?Summary ?A pulmonary nodule is a small, round growth of tissue in the lung. Most of these nodules are not cancer. ?Common causes of nodules in the lung include infection, swelling and irritation, and growths that are not cancer. ?Treatment depends on whether the nodule is cancer or is not cancer. Treatment also depends on your risk of getting cancer. ?If the nodule is cancer, you will need certain tests and treatments as told by your doctor. ?This information is not intended to replace advice given to you by your health care provider. Make sure you discuss any questions you have with your health care provider. ?Document Revised:   10/31/2019 Document Reviewed: 10/31/2019 ?Elsevier Patient Education ? 2022 [employer] ? ?

## 2021-06-02 NOTE — Telephone Encounter (Signed)
Routine chest CT ordered. Printed. Gave to Titania-Toni ?

## 2021-06-11 ENCOUNTER — Other Ambulatory Visit: Payer: Self-pay | Admitting: Internal Medicine

## 2021-06-11 DIAGNOSIS — Z1231 Encounter for screening mammogram for malignant neoplasm of breast: Secondary | ICD-10-CM

## 2021-06-30 NOTE — Procedures (Signed)
[facility] [address] [place], [address]    Complete Pulmonary Function Testing Interpretation:  FINDINGS:  Forced vital capacity is normal.  FEV1 is normal.  F1 FVC ratio is normal.  Total lung capacity is normal residual volume is mildly decreased.  Residual volume total lung capacity ratio is decreased.  FRC is decreased.  DLCO was within normal limits.  Postbronchodilator no significant change in the FEV1.  IMPRESSION:  Pulmonary function study is basically within normal limits clinical correlation is recommended  Yevonne Pax, MD [facility] Pulmonary Critical Care Medicine Sleep Medicine

## 2021-07-01 ENCOUNTER — Encounter: Payer: Self-pay | Admitting: Internal Medicine

## 2021-07-01 LAB — PULMONARY FUNCTION TEST

## 2021-07-02 ENCOUNTER — Ambulatory Visit
Admission: RE | Admit: 2021-07-02 | Discharge: 2021-07-02 | Disposition: A | Discharge status: Home / Self Care | Payer: PRIVATE HEALTH INSURANCE | Source: Ambulatory Visit | Attending: Internal Medicine | Admitting: Internal Medicine

## 2021-07-02 ENCOUNTER — Other Ambulatory Visit: Payer: Self-pay | Admitting: Internal Medicine

## 2021-07-02 ENCOUNTER — Other Ambulatory Visit: Payer: Self-pay

## 2021-07-02 DIAGNOSIS — E782 Mixed hyperlipidemia: Secondary | ICD-10-CM

## 2021-07-02 DIAGNOSIS — I10 Essential (primary) hypertension: Secondary | ICD-10-CM

## 2021-07-02 IMAGING — CT CT CARDIAC CORONARY ARTERY CALCIUM SCORE
3 series · 14 of 20 positions shown, 16 images · non-contrast
Comparison: [DATE] diagnostic CT.
COMPARISON: [DATE] diagnostic CT.

Addendum:
EXAM:
OVER-READ INTERPRETATION  CT CHEST

The following report is an over-read performed by radiologist Dr.
ANTONOWICZ [REDACTED] on [DATE]. This over-read
does not include interpretation of cardiac or coronary anatomy or
pathology. The calcium score interpretation by the cardiologist is
attached.
CLINICAL DATA: Risk stratification
Coronary Calcium Score
TECHNIQUE: The patient was scanned on a Siemens Somatom go.Top Scanner. Axial
non-contrast 3 mm slices were carried out through the heart. The
data set was analyzed on a dedicated work station and scored using
the Agatson method.

[Series 2: sa36 calcium scoring 3.00 · axial · 0.32mm/px · z∈[-1075,-994]mm · 4 of 46 slices shown]
[im 10/46  vessel]
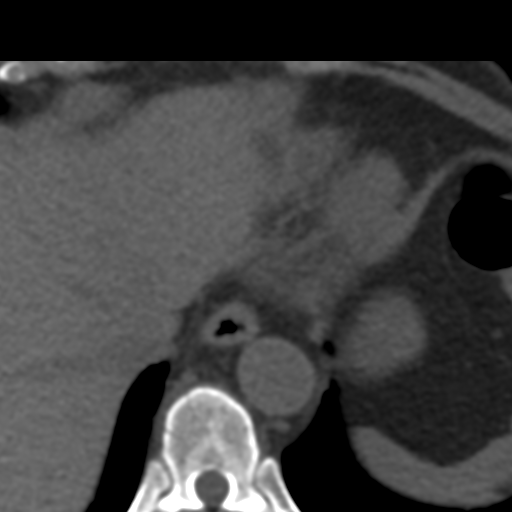
[im 19/46  vessel]
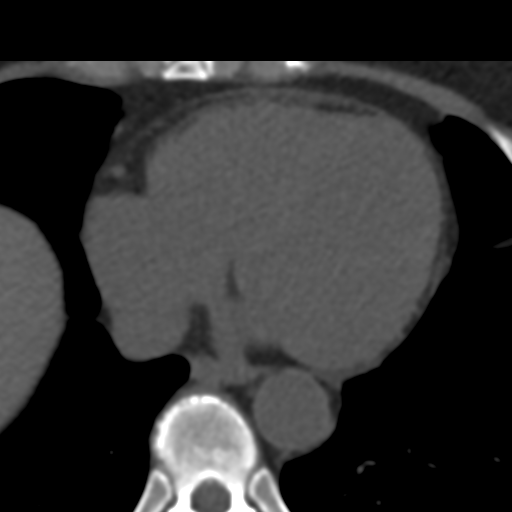
[im 28/46  vessel]
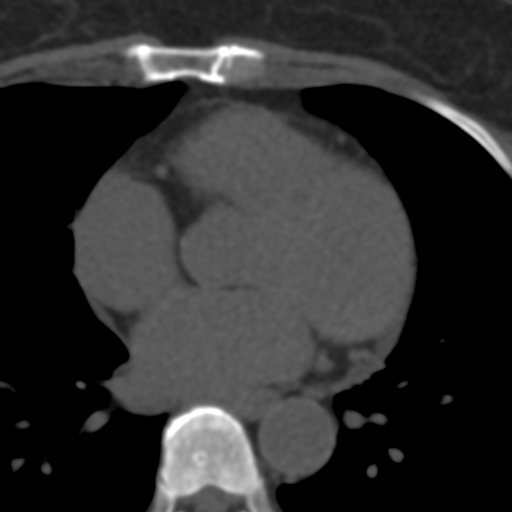
[im 37/46  vessel]
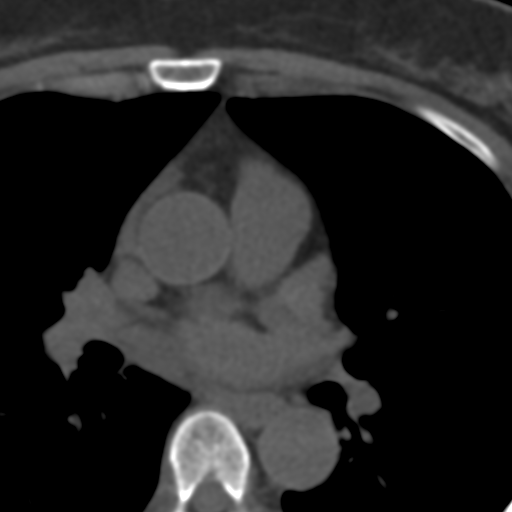

[Series 5: full fov st calcium scoring 3.00 · axial · 0.59mm/px · z∈[-1081,-991]mm · 5 of 46 slices shown, 7 images]
[im 8/46  vessel]
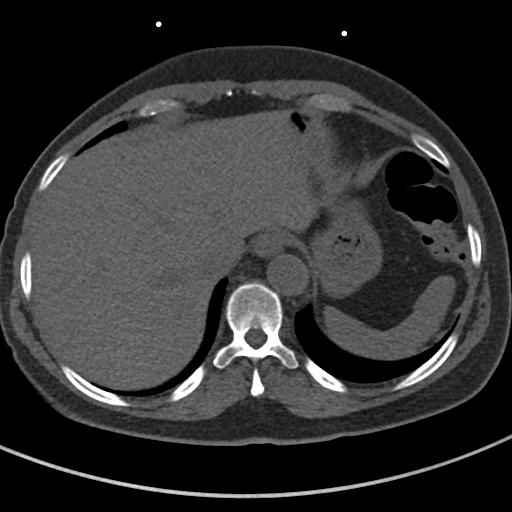
[im 8/46  lung]
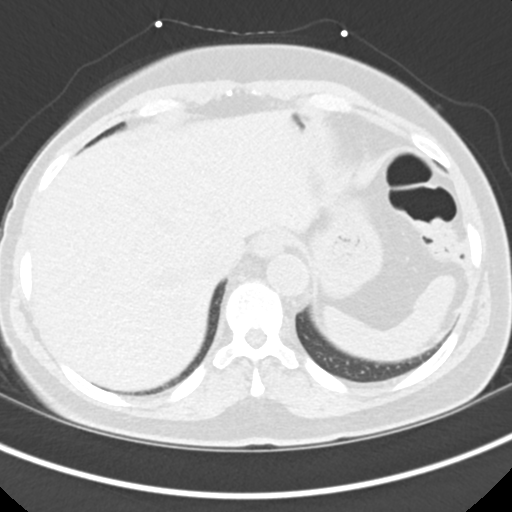
[im 16/46  vessel]
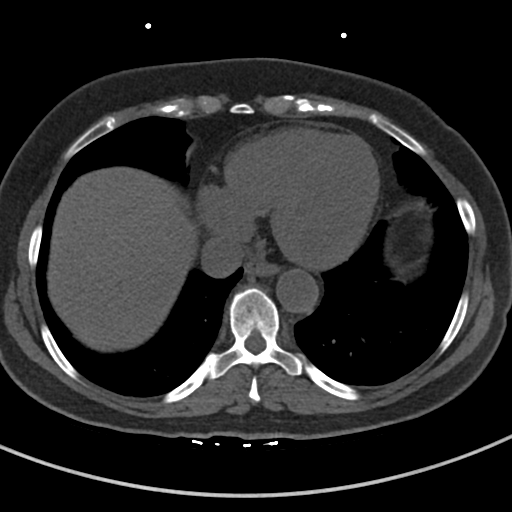
[im 23/46  vessel]
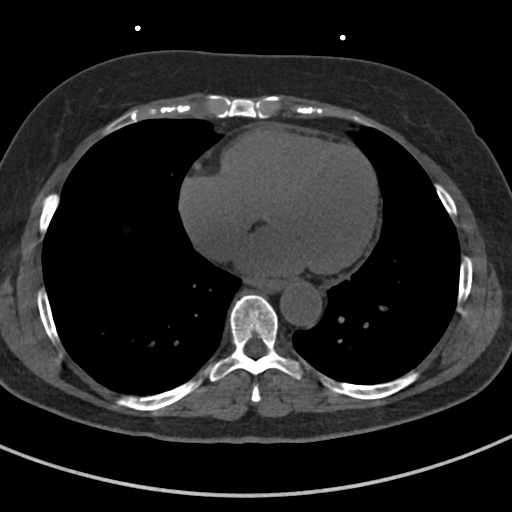
[im 31/46  vessel]
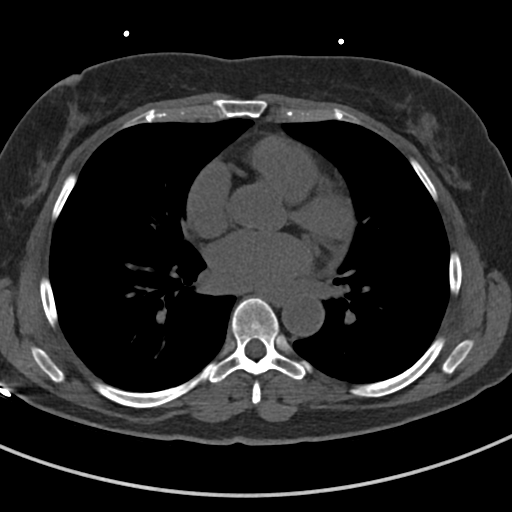
[im 38/46  vessel]
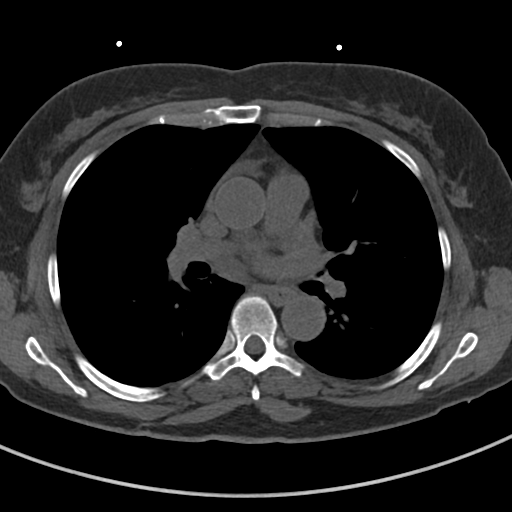
[im 38/46  lung]
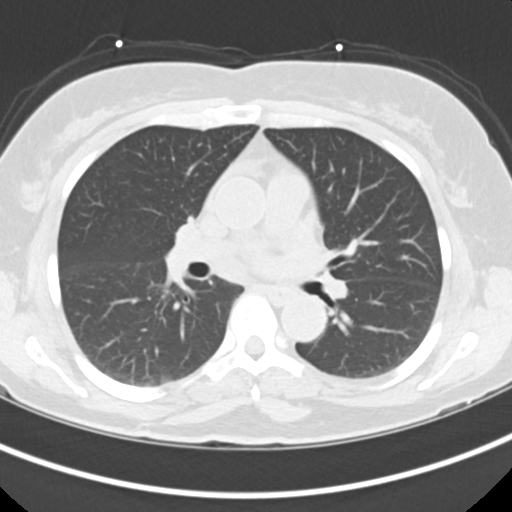

[Series 10: full fov lungs calcium scoring 3.00 ax · axial · 0.59mm/px · z∈[-1081,-991]mm · 5 of 46 slices shown]
[im 8/46  vessel]
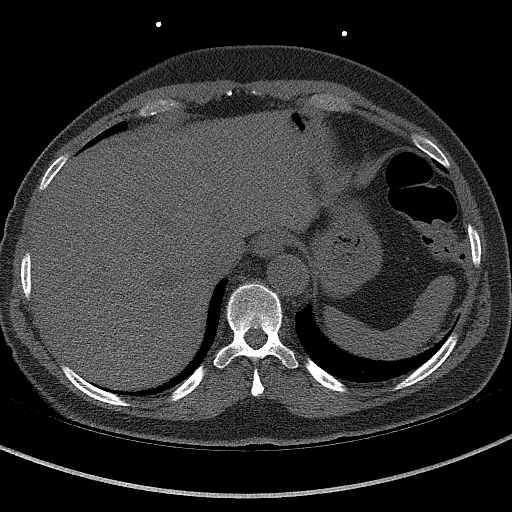
[im 16/46  vessel]
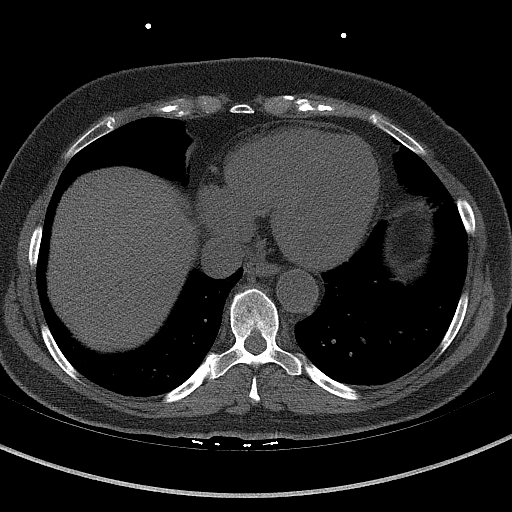
[im 23/46  vessel]
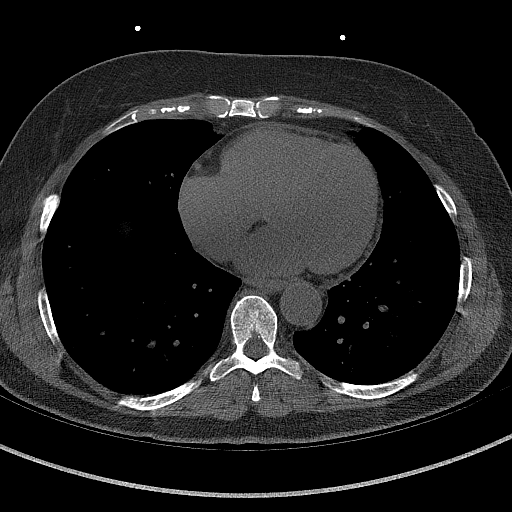
[im 31/46  vessel]
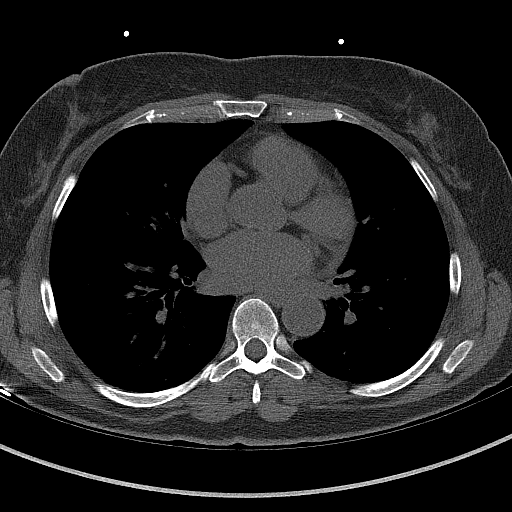
[im 38/46  vessel]
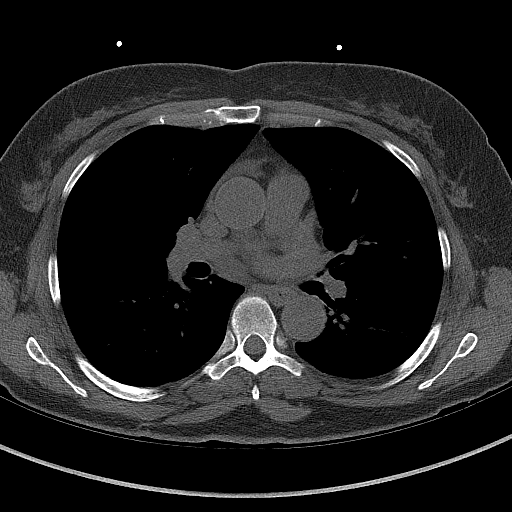

[14 of 20 positions shown; findings below may reference images not displayed]

FINDINGS: Vascular: Normal aortic caliber.

Mediastinum/Nodes: No imaged thoracic adenopathy.

Lungs/Pleura: No pleural fluid. Clear imaged lungs. The level of the
previously described left upper lobe pulmonary nodule is not
included.

Upper Abdomen: Normal imaged portions of the liver, spleen, stomach.

Musculoskeletal: No acute osseous abnormality.
IMPRESSION: No acute findings in the imaged extracardiac chest.
FINDINGS: Non-cardiac: See separate report from [REDACTED].

Ascending Aorta: Normal size

Pericardium: Normal

Coronary arteries: Normal origin of left and right coronary
arteries. Distribution of arterial calcifications if present, as
noted below;

LM 0

LAD 0

LCx 0

RCA 0

Total 0

IMPRESSION AND RECOMMENDATION:
1. Normal coronary calcium score of 0. Patient is low risk for
coronary events.

2.  CAC 0, ANTONOWICZ0.

3.  Continue heart healthy lifestyle and risk factor modification.

ANTONOWICZ

*** End of Addendum ***
EXAM:
OVER-READ INTERPRETATION  CT CHEST

The following report is an over-read performed by radiologist Dr.
ANTONOWICZ [REDACTED] on [DATE]. This over-read
does not include interpretation of cardiac or coronary anatomy or
pathology. The calcium score interpretation by the cardiologist is
attached.
FINDINGS: Vascular: Normal aortic caliber.

Mediastinum/Nodes: No imaged thoracic adenopathy.

Lungs/Pleura: No pleural fluid. Clear imaged lungs. The level of the
previously described left upper lobe pulmonary nodule is not
included.

Upper Abdomen: Normal imaged portions of the liver, spleen, stomach.

Musculoskeletal: No acute osseous abnormality.
IMPRESSION: No acute findings in the imaged extracardiac chest.

## 2021-08-13 ENCOUNTER — Telehealth: Payer: Self-pay

## 2021-08-13 NOTE — Telephone Encounter (Signed)
Patient scheduled for ct chest on 08/25/21 @ 11:30 armc/tat ?

## 2021-08-25 ENCOUNTER — Ambulatory Visit
Admission: RE | Admit: 2021-08-25 | Discharge: 2021-08-25 | Disposition: A | Discharge status: Home / Self Care | Payer: Medicare Other | Source: Ambulatory Visit | Attending: Internal Medicine | Admitting: Internal Medicine

## 2021-08-25 DIAGNOSIS — R911 Solitary pulmonary nodule: Secondary | ICD-10-CM | POA: Diagnosis not present

## 2021-08-25 IMAGING — CT CT CHEST NODULE FOLLOW UP LOW DOSE W/O CM
2 of 5 series · 15 of 40 positions shown, 18 images · non-contrast
Comparison: [DATE]

CLINICAL DATA: Pulmonary nodule.  Follow-up.



[Series 3: lungs · axial · 0.64mm/px · z∈[-103,+153]mm · 12 of 152 slices shown, 15 images]
[im 12/152  mediastinal]
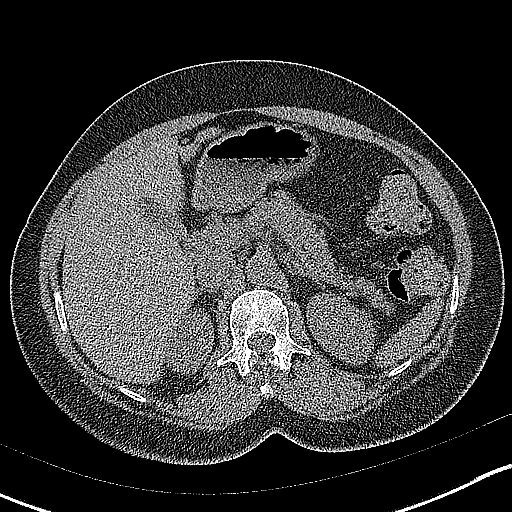
[im 12/152  lung]
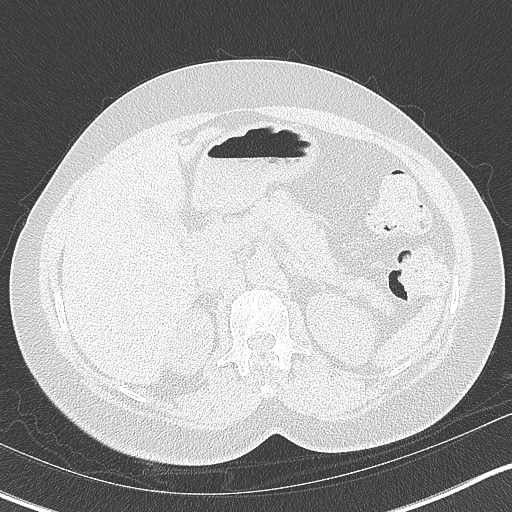
[im 24/152  lung]
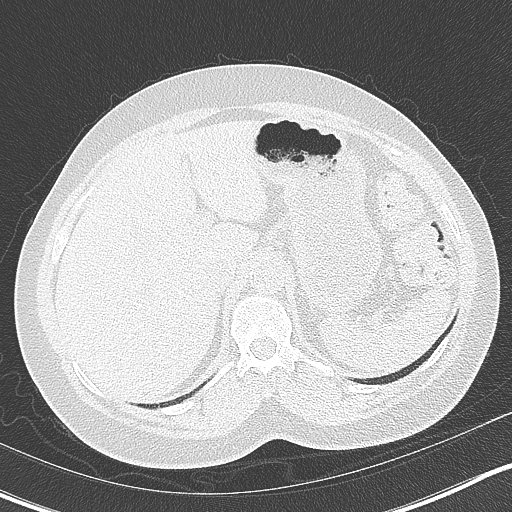
[im 35/152  lung]
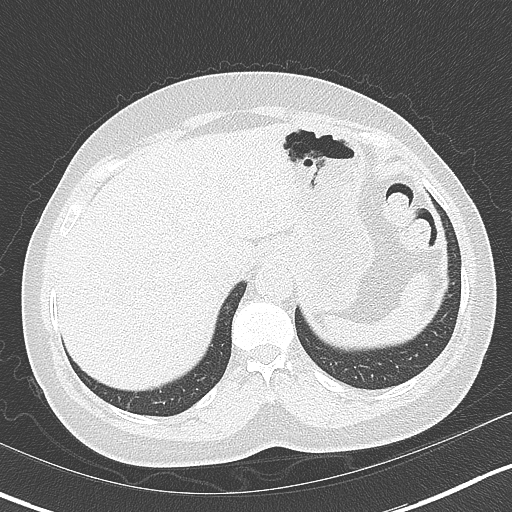
[im 47/152  lung]
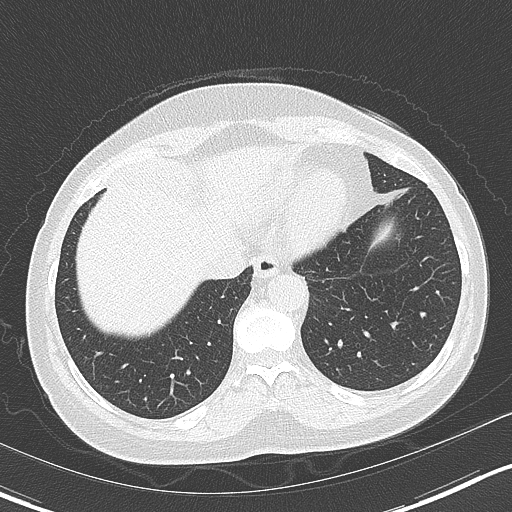
[im 59/152  mediastinal]
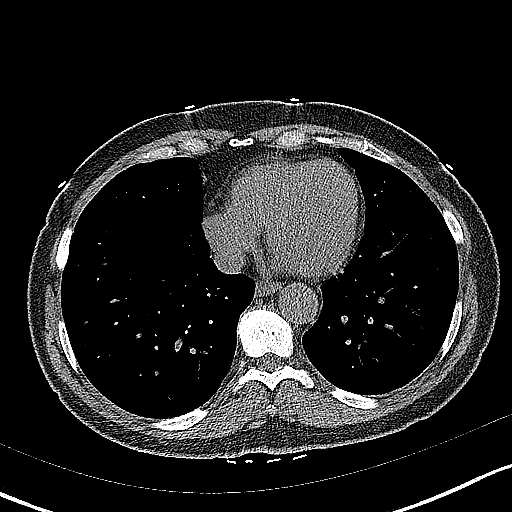
[im 59/152  lung]
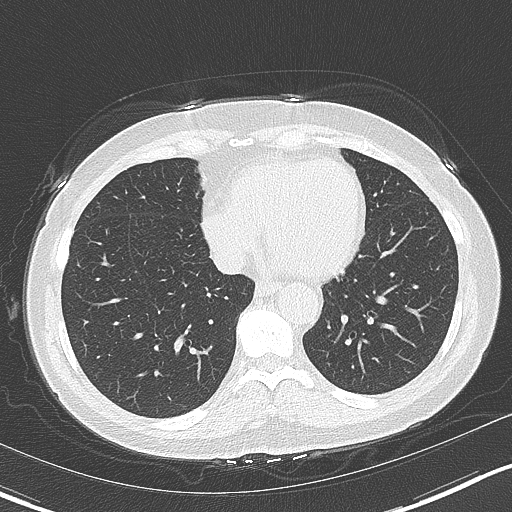
[im 70/152  lung]
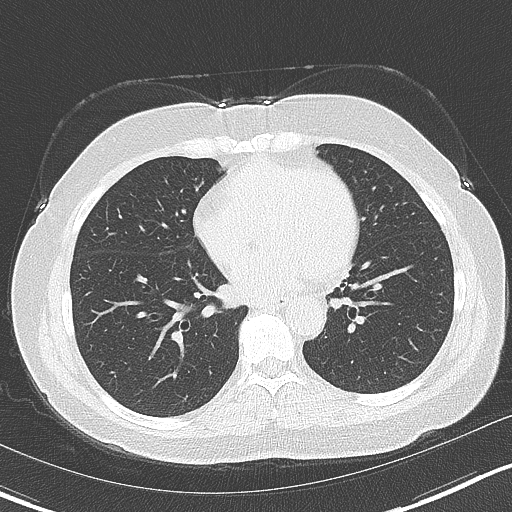
[im 82/152  lung]
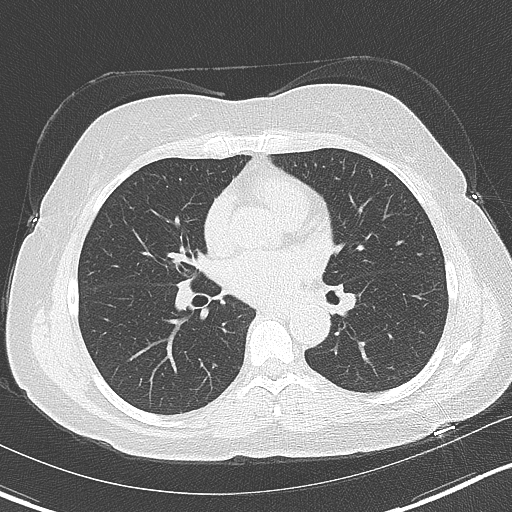
[im 93/152  lung]
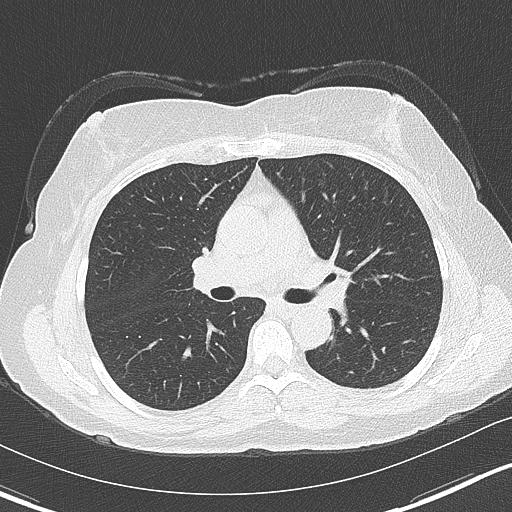
[im 105/152  mediastinal]
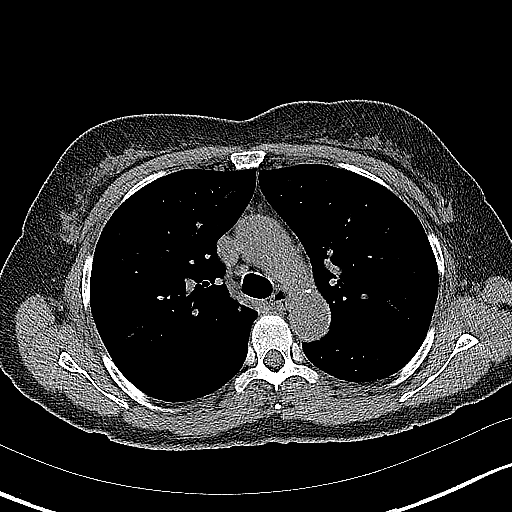
[im 105/152  lung]
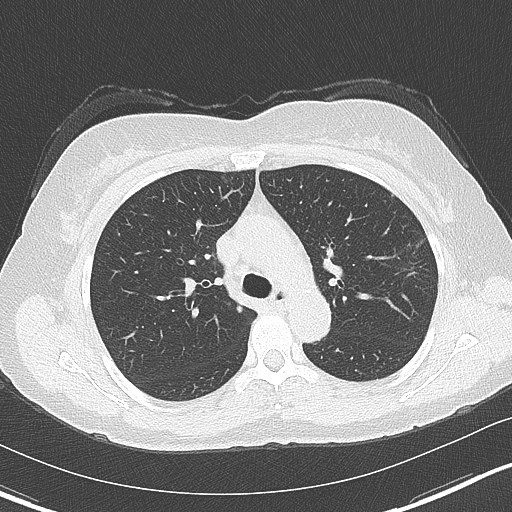
[im 117/152  lung]
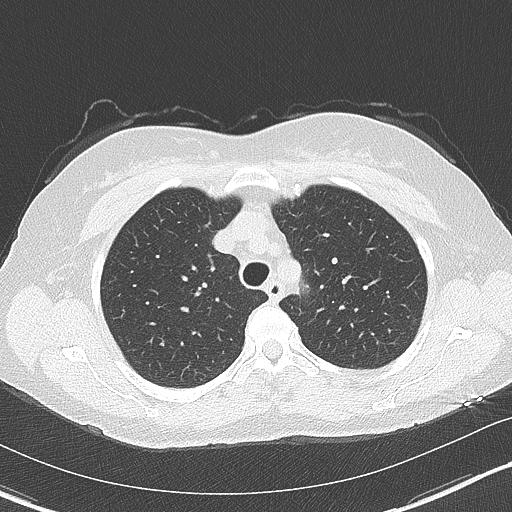
[im 128/152  lung]
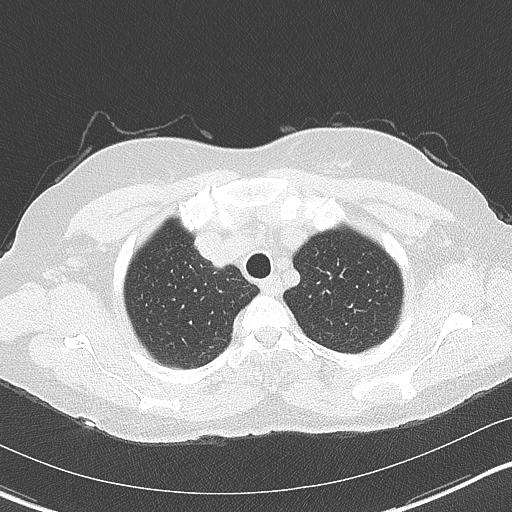
[im 140/152  lung]
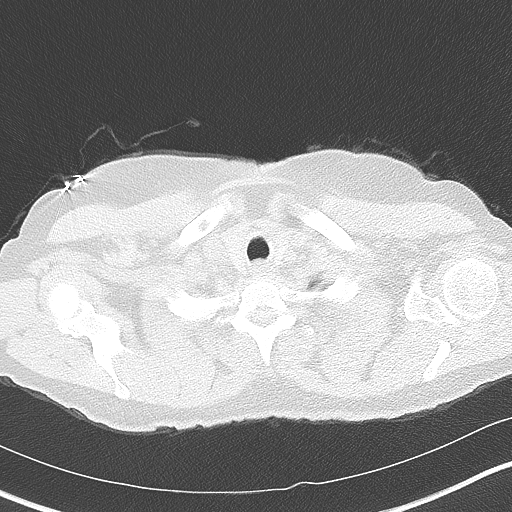

[Series 5: coronal · coronal · 0.60mm/px · 3 of 123 slices shown]
[im 25/123  lung]
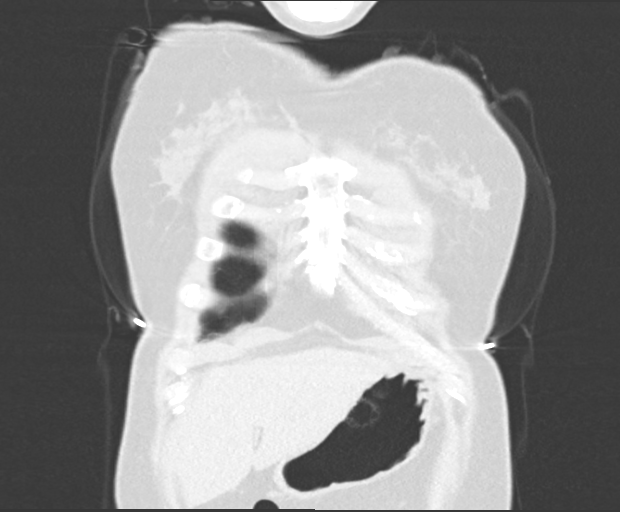
[im 49/123  lung]
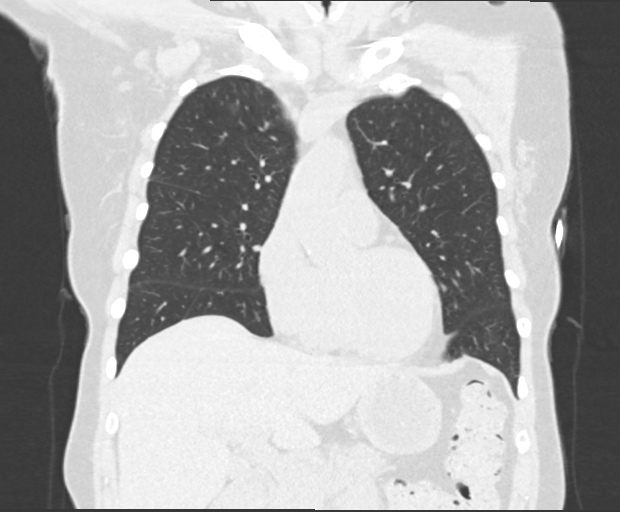
[im 74/123  lung]
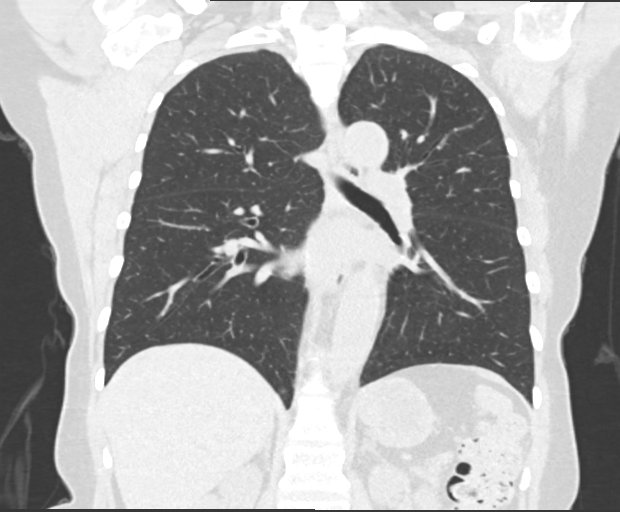

[15 of 40 positions shown; findings below may reference images not displayed]

FINDINGS: Cardiovascular: The heart size is normal. No substantial pericardial
effusion. No thoracic aortic aneurysm. No substantial
atherosclerosis of the thoracic aorta.

Mediastinum/Nodes: No mediastinal lymphadenopathy. No evidence for
gross hilar lymphadenopathy although assessment is limited by the
lack of intravenous contrast on the current study. The esophagus has
normal imaging features. There is no axillary lymphadenopathy.

Lungs/Pleura: 9 mm peripheral left upper lobe pulmonary nodule on
46/3 with some subtle adjacent ground-glass opacity is unchanged in
the 3 month interval since [DATE]. No new suspicious pulmonary
nodule or mass. No focal airspace consolidation. No pleural
effusion.

Upper Abdomen: Unremarkable.

Musculoskeletal: No worrisome lytic or sclerotic osseous
abnormality.
IMPRESSION: 9 mm peripheral left upper lobe pulmonary nodule with some subtle
adjacent ground-glass opacity is unchanged in the 3 month interval
since [DATE]. Interval stability is reassuring for benign
etiology. Consider follow-up CT chest without contrast in 6-12
months to ensure continued stability.

## 2021-09-22 ENCOUNTER — Ambulatory Visit: Payer: Medicare Other | Admitting: Internal Medicine

## 2022-02-19 ENCOUNTER — Encounter: Payer: Medicare Other | Admitting: Physician Assistant

## 2022-03-01 ENCOUNTER — Telehealth: Payer: Self-pay | Admitting: Physician Assistant

## 2022-03-01 NOTE — Telephone Encounter (Signed)
Left vm to confirm 03/08/22 appointment-Toni

## 2022-03-08 ENCOUNTER — Ambulatory Visit: Payer: Medicare Other | Admitting: Physician Assistant
